# Patient Record
Sex: Female | Born: 1945 | Race: White | Hispanic: No | Smoking: Never smoker
Health system: Southern US, Community
[De-identification: ages and names within clinical notes are randomized; demographics above are authoritative.]

## PROBLEM LIST (undated history)

## (undated) DIAGNOSIS — J069 Acute upper respiratory infection, unspecified: Secondary | ICD-10-CM

## (undated) HISTORY — PX: HIP SURGERY: SHX245

## (undated) HISTORY — PX: ABDOMINAL HYSTERECTOMY: SHX81

---

## 1999-08-24 ENCOUNTER — Encounter: Payer: Self-pay | Admitting: Emergency Medicine

## 1999-08-24 ENCOUNTER — Emergency Department (HOSPITAL_COMMUNITY): Admission: EM | Admit: 1999-08-24 | Discharge: 1999-08-24 | Payer: Self-pay | Admitting: Emergency Medicine

## 1999-09-08 ENCOUNTER — Emergency Department (HOSPITAL_COMMUNITY): Admission: EM | Admit: 1999-09-08 | Discharge: 1999-09-08 | Payer: Self-pay | Admitting: Emergency Medicine

## 2002-03-31 ENCOUNTER — Encounter: Admission: RE | Admit: 2002-03-31 | Discharge: 2002-03-31 | Payer: Self-pay | Admitting: *Deleted

## 2019-05-04 ENCOUNTER — Emergency Department (HOSPITAL_COMMUNITY): Payer: PPO

## 2019-05-04 ENCOUNTER — Other Ambulatory Visit: Payer: Self-pay

## 2019-05-04 ENCOUNTER — Encounter (HOSPITAL_COMMUNITY): Payer: Self-pay | Admitting: *Deleted

## 2019-05-04 ENCOUNTER — Emergency Department (HOSPITAL_COMMUNITY)
Admission: EM | Admit: 2019-05-04 | Discharge: 2019-05-04 | Disposition: A | Discharge status: Home / Self Care | Payer: PPO | Attending: Emergency Medicine | Admitting: Emergency Medicine

## 2019-05-04 DIAGNOSIS — M6281 Muscle weakness (generalized): Secondary | ICD-10-CM

## 2019-05-04 DIAGNOSIS — Z20822 Contact with and (suspected) exposure to covid-19: Secondary | ICD-10-CM | POA: Insufficient documentation

## 2019-05-04 DIAGNOSIS — R531 Weakness: Secondary | ICD-10-CM | POA: Diagnosis not present

## 2019-05-04 DIAGNOSIS — M791 Myalgia, unspecified site: Secondary | ICD-10-CM | POA: Diagnosis present

## 2019-05-04 DIAGNOSIS — R262 Difficulty in walking, not elsewhere classified: Secondary | ICD-10-CM | POA: Diagnosis not present

## 2019-05-04 HISTORY — DX: Acute upper respiratory infection, unspecified: J06.9

## 2019-05-04 LAB — CBC
HCT: 39.4 % (ref 36.0–46.0)
Hemoglobin: 13.2 g/dL (ref 12.0–15.0)
MCH: 30.1 pg (ref 26.0–34.0)
MCHC: 33.5 g/dL (ref 30.0–36.0)
MCV: 90 fL (ref 80.0–100.0)
Platelets: 161 10*3/uL (ref 150–400)
RBC: 4.38 MIL/uL (ref 3.87–5.11)
RDW: 12.9 % (ref 11.5–15.5)
WBC: 7.2 10*3/uL (ref 4.0–10.5)
nRBC: 0 % (ref 0.0–0.2)

## 2019-05-04 LAB — COMPREHENSIVE METABOLIC PANEL
ALT: 15 U/L (ref 0–44)
AST: 47 U/L — ABNORMAL HIGH (ref 15–41)
Albumin: 4.1 g/dL (ref 3.5–5.0)
Alkaline Phosphatase: 153 U/L — ABNORMAL HIGH (ref 38–126)
Anion gap: 10 (ref 5–15)
BUN: 30 mg/dL — ABNORMAL HIGH (ref 8–23)
CO2: 25 mmol/L (ref 22–32)
Calcium: 9.2 mg/dL (ref 8.9–10.3)
Chloride: 107 mmol/L (ref 98–111)
Creatinine, Ser: 0.8 mg/dL (ref 0.44–1.00)
GFR calc Af Amer: >60 mL/min (ref >60)
GFR calc non Af Amer: >60 mL/min (ref >60)
Glucose, Bld: 99 mg/dL (ref 70–99)
Potassium: 3.6 mmol/L (ref 3.5–5.1)
Sodium: 142 mmol/L (ref 135–145)
Total Bilirubin: 0.6 mg/dL (ref 0.3–1.2)
Total Protein: 7.6 g/dL (ref 6.5–8.1)

## 2019-05-04 LAB — SARS CORONAVIRUS 2 (TAT 6-24 HRS): SARS Coronavirus 2: NEGATIVE

## 2019-05-04 LAB — SEDIMENTATION RATE: Sed Rate: 48 mm/hr — ABNORMAL HIGH (ref 0–22)

## 2019-05-04 LAB — C-REACTIVE PROTEIN: CRP: 2.3 mg/dL — ABNORMAL HIGH (ref <1.0)

## 2019-05-04 LAB — TSH: TSH: 1.984 u[IU]/mL (ref 0.350–4.500)

## 2019-05-04 MED ORDER — KETOROLAC TROMETHAMINE 15 MG/ML IJ SOLN
15.0000 mg | Freq: Once | INTRAMUSCULAR | Status: AC
  Administered 2019-05-04: 16:00:00 15 mg via INTRAVENOUS
  Filled 2019-05-04: qty 1

## 2019-05-04 MED ORDER — PREDNISONE 10 MG PO TABS: 20.0000 mg | ORAL_TABLET | Freq: Every day | ORAL | 0 refills | Status: DC

## 2019-05-04 NOTE — ED Notes (Signed)
Pt ambulatory to bathroom using walker, no assistance needed.

## 2019-05-04 NOTE — ED Triage Notes (Signed)
Pt states she is having pain in her legs and arms for over two weeks, trouble walking and would like for "ya'll to know, I have a lump in my breat" (right). States she is having trouble walking due to leg pain.

## 2019-05-04 NOTE — ED Provider Notes (Signed)
[facility] DEPT Provider Note   CSN: [id number] Arrival date & time: 05/04/19  1018     History Chief Complaint  Patient presents with  . Generalized Body Aches    Yesenia Sullivan is a 74 y.o. female.  [name] is a 74 year old woman presenting to the ED with 2 to 3 weeks of worsening weakness.  She reports that, 3 weeks ago, she had no trouble walking at all and felt well and healthy.  Since that time, she has had progressive weakness of her legs and arms which is often accompanied by aching and burning sensation.  She initially tried treating this with over-the-counter Tylenol and Motrin which was helpful but has become less effective as time goes on.  About 2 weeks ago, she began to have mild difficulty walking due to weakness and aching in her thighs and knees.  She continues to try to exercise daily with walking or her stationary bike but she notes increased difficulty bringing her legs over the seat of her bike.  In addition to the aching and weakness in her thighs and upper arms, she also notes some upper and middle back aching without radiation.  On review of systems, she notes some decreased energy and denies headaches, vision changes, decreased appetite, congestion, shortness of breath, urinary symptoms.  Aside from her complaints of weakness and aching, she also brought up a breast mass that has been present for roughly the past 2 years.  She has noted this breast mass and the accompanying skin changes but is been hesitant to be evaluated by physician because she worries about the possibility of breast cancer and how she would respond to chemotherapy.  She denies any personal or family history of hypothyroidism or known rheumatic issues such as rheumatoid arthritis, PMR, dermatomyositis.  She does not currently take any medications.        Past Medical History:  Diagnosis Date  . URI (upper respiratory infection)     There are no  problems to display for this patient.   Past Surgical History:  Procedure Laterality Date  . ABDOMINAL HYSTERECTOMY    . HIP SURGERY       OB History   No obstetric history on file.     History reviewed. No pertinent family history.  Social History   Tobacco Use  . Smoking status: Never Smoker  . Smokeless tobacco: Never Used  Substance Use Topics  . Alcohol use: Never  . Drug use: Never    Home Medications Prior to Admission medications   Not on File    Allergies    Codeine and Penicillins  Review of Systems   Review of Systems  Constitutional: Positive for activity change and fatigue. Negative for fever.  HENT: Negative for congestion, hearing loss and sore throat.   Eyes: Negative for visual disturbance.  Respiratory: Negative for cough, chest tightness, shortness of breath and wheezing.   Cardiovascular: Negative for chest pain and palpitations.  Gastrointestinal: Negative for abdominal distention, abdominal pain, blood in stool, constipation, diarrhea, nausea and vomiting.  Endocrine: Negative for polyuria.  Genitourinary: Negative for dysuria.  Musculoskeletal: Positive for arthralgias, back pain and gait problem. Negative for joint swelling and neck stiffness.  Skin: Negative for color change and rash.  Neurological: Positive for weakness. Negative for dizziness, tremors, seizures, speech difficulty and headaches.  Psychiatric/Behavioral: Negative.     Physical Exam Updated Vital Signs BP (!) 162/108 (BP Location: Left Arm) Comment: nurse was informed  Pulse Marland Kitchen)  102   Temp 97.8 F (36.6 C) (Oral)   Resp 18   Ht 5\' 4"  (1.626 m)   Wt 73.9 kg   SpO2 96%   BMI 27.98 kg/m   Physical Exam Constitutional:      General: She is not in acute distress.    Appearance: Normal appearance. She is normal weight. She is not ill-appearing.  HENT:     Head: Normocephalic and atraumatic.     Nose: Nose normal.  Eyes:     Conjunctiva/sclera: Conjunctivae  normal.     Pupils: Pupils are equal, round, and reactive to light.  Cardiovascular:     Rate and Rhythm: Regular rhythm. Tachycardia present.     Pulses: Normal pulses.     Heart sounds: Normal heart sounds. No murmur. No gallop.   Pulmonary:     Effort: Pulmonary effort is normal. No respiratory distress.     Breath sounds: Normal breath sounds. No wheezing or rales.  Abdominal:     General: Abdomen is flat. Bowel sounds are normal. There is no distension.     Palpations: Abdomen is soft.     Tenderness: There is no abdominal tenderness. There is no guarding.  Musculoskeletal:        General: Tenderness present. No swelling.     Cervical back: Normal range of motion and neck supple. No rigidity or tenderness.     Right lower leg: No edema.     Left lower leg: No edema.     Comments: 5/5 strength for elbow flexion and extension then grip strength.  4/5 strength for hip flexion and knee flexion.  5/5 strength for plantar flexion, dorsiflexion.  Mild tenderness to palpation of the left paraspinal muscles in the lumbar region.  Skin:    General: Skin is warm and dry.     Comments: Roughly 1.5 cm hard nodule noted in her superior right breast with significant skin changes including erythema.  No evidence of drainage.  No changes to the nipple.  Neurological:     Mental Status: She is alert.     ED Results / Procedures / Treatments   Labs (all labs ordered are listed, but only abnormal results are displayed) Labs Reviewed  CBC  COMPREHENSIVE METABOLIC PANEL  SEDIMENTATION RATE  C-REACTIVE PROTEIN    EKG None  Radiology No results found.  Procedures Procedures (including critical care time)  Medications Ordered in ED Medications - No data to display  ED Course  I have reviewed the triage vital signs and the nursing notes.  Pertinent labs & imaging results that were available during my care of the patient were reviewed by me and considered in my medical decision making  (see chart for details).    MDM Rules/Calculators/A&P                      Yesenia Sullivan is a 74 year old woman presenting to the ED with 3 weeks of proximal muscle weakness/aching.  Low suspicion for infectious etiology based on lack of fever focal infectious symptoms.  Greater suspicion for rheumatologic/neurologic etiology.  Differential includes inflammatory issues such as PMR, dermatomyositis, demyelination syndrome, possible breast cancer with brain metastases.  We will begin initial work-up with basic lab work and inflammatory markers in addition to MR brain to assess for brain mets. Final Clinical Impression(s) / ED Diagnoses Final diagnoses:  None    Rx / DC Orders ED Discharge Orders    None  Matilde Haymaker, MD 05/04/19 1740    Carmin Muskrat, MD 05/06/19 [phone]

## 2019-05-04 NOTE — Discharge Instructions (Signed)
You came into the emergency room today primarily concern for weakness of your thighs and upper arms.  Based on our findings today, this may be related to general inflammatory process that is affecting her muscles.  We will plan to treat you with steroids for now and would like you to follow-up with a primary care physician.    We also got head imaging to rule out possible metastases that could be related to a breast cancer.  There were spots noted on the bones of your skull and we are not positive what the spots are.  The radiologist is recommended reimaging with contrast to help better characterize the spots.  I highly recommend that you have this imaging done when you follow-up with your new primary care provider.  I have also attached information for the breast center.  This is for follow-up for the right-sided breast mass that you noted.  I am concerned this breast mass could be cancerous and could be causing other problems.  You can start working up this problem by talking with your new primary care provider or you can try to schedule an appointment directly with the breast center.  Please try to find a primary care provider and be seen in the next week or 2.  I think that you will experience improvement with the steroids that were giving you but your weakness and discomfort may return once you finish the steroids.  If you have trouble finding a primary care provider, feel free to call my family medicine office to ask for an appointment with me.  You can ask for an appointment with Dr. Matilde Haymaker let them know that you were assessed by me in the emergency room.

## 2019-05-10 ENCOUNTER — Emergency Department (HOSPITAL_COMMUNITY): Payer: PPO

## 2019-05-10 ENCOUNTER — Inpatient Hospital Stay (HOSPITAL_COMMUNITY)
Admission: EM | Admit: 2019-05-10 | Discharge: 2019-05-18 | DRG: 478 | Disposition: A | Discharge status: Home Health | Payer: PPO | Attending: Internal Medicine | Admitting: Internal Medicine

## 2019-05-10 ENCOUNTER — Other Ambulatory Visit: Payer: Self-pay

## 2019-05-10 ENCOUNTER — Encounter (HOSPITAL_COMMUNITY): Payer: Self-pay

## 2019-05-10 ENCOUNTER — Other Ambulatory Visit: Payer: Self-pay | Admitting: Family Medicine

## 2019-05-10 DIAGNOSIS — C787 Secondary malignant neoplasm of liver and intrahepatic bile duct: Secondary | ICD-10-CM | POA: Diagnosis not present

## 2019-05-10 DIAGNOSIS — Z419 Encounter for procedure for purposes other than remedying health state, unspecified: Secondary | ICD-10-CM

## 2019-05-10 DIAGNOSIS — Z20822 Contact with and (suspected) exposure to covid-19: Secondary | ICD-10-CM | POA: Diagnosis not present

## 2019-05-10 DIAGNOSIS — Z8781 Personal history of (healed) traumatic fracture: Secondary | ICD-10-CM

## 2019-05-10 DIAGNOSIS — R52 Pain, unspecified: Secondary | ICD-10-CM

## 2019-05-10 DIAGNOSIS — S72401D Unspecified fracture of lower end of right femur, subsequent encounter for closed fracture with routine healing: Secondary | ICD-10-CM | POA: Diagnosis not present

## 2019-05-10 DIAGNOSIS — R9431 Abnormal electrocardiogram [ECG] [EKG]: Secondary | ICD-10-CM | POA: Diagnosis not present

## 2019-05-10 DIAGNOSIS — C50811 Malignant neoplasm of overlapping sites of right female breast: Secondary | ICD-10-CM

## 2019-05-10 DIAGNOSIS — S42301A Unspecified fracture of shaft of humerus, right arm, initial encounter for closed fracture: Secondary | ICD-10-CM | POA: Diagnosis not present

## 2019-05-10 DIAGNOSIS — R531 Weakness: Secondary | ICD-10-CM | POA: Diagnosis not present

## 2019-05-10 DIAGNOSIS — Z170 Estrogen receptor positive status [ER+]: Secondary | ICD-10-CM | POA: Diagnosis not present

## 2019-05-10 DIAGNOSIS — S42302A Unspecified fracture of shaft of humerus, left arm, initial encounter for closed fracture: Secondary | ICD-10-CM | POA: Diagnosis not present

## 2019-05-10 DIAGNOSIS — Z66 Do not resuscitate: Secondary | ICD-10-CM | POA: Diagnosis not present

## 2019-05-10 DIAGNOSIS — Z90710 Acquired absence of both cervix and uterus: Secondary | ICD-10-CM | POA: Diagnosis not present

## 2019-05-10 DIAGNOSIS — W19XXXA Unspecified fall, initial encounter: Secondary | ICD-10-CM | POA: Diagnosis present

## 2019-05-10 DIAGNOSIS — Z515 Encounter for palliative care: Secondary | ICD-10-CM | POA: Diagnosis not present

## 2019-05-10 DIAGNOSIS — M898X9 Other specified disorders of bone, unspecified site: Secondary | ICD-10-CM | POA: Diagnosis not present

## 2019-05-10 DIAGNOSIS — S72401A Unspecified fracture of lower end of right femur, initial encounter for closed fracture: Secondary | ICD-10-CM | POA: Diagnosis not present

## 2019-05-10 DIAGNOSIS — Z79899 Other long term (current) drug therapy: Secondary | ICD-10-CM | POA: Diagnosis not present

## 2019-05-10 DIAGNOSIS — Z03818 Encounter for observation for suspected exposure to other biological agents ruled out: Secondary | ICD-10-CM | POA: Diagnosis not present

## 2019-05-10 DIAGNOSIS — K769 Liver disease, unspecified: Secondary | ICD-10-CM | POA: Diagnosis not present

## 2019-05-10 DIAGNOSIS — M898X5 Other specified disorders of bone, thigh: Secondary | ICD-10-CM

## 2019-05-10 DIAGNOSIS — S72301A Unspecified fracture of shaft of right femur, initial encounter for closed fracture: Secondary | ICD-10-CM | POA: Diagnosis not present

## 2019-05-10 DIAGNOSIS — M84451A Pathological fracture, right femur, initial encounter for fracture: Principal | ICD-10-CM | POA: Diagnosis present

## 2019-05-10 DIAGNOSIS — Z7189 Other specified counseling: Secondary | ICD-10-CM | POA: Diagnosis not present

## 2019-05-10 DIAGNOSIS — C7951 Secondary malignant neoplasm of bone: Secondary | ICD-10-CM | POA: Diagnosis not present

## 2019-05-10 DIAGNOSIS — Z9889 Other specified postprocedural states: Secondary | ICD-10-CM

## 2019-05-10 DIAGNOSIS — S79911A Unspecified injury of right hip, initial encounter: Secondary | ICD-10-CM | POA: Diagnosis not present

## 2019-05-10 DIAGNOSIS — S72143A Displaced intertrochanteric fracture of unspecified femur, initial encounter for closed fracture: Secondary | ICD-10-CM | POA: Diagnosis not present

## 2019-05-10 DIAGNOSIS — R279 Unspecified lack of coordination: Secondary | ICD-10-CM | POA: Diagnosis not present

## 2019-05-10 DIAGNOSIS — M899 Disorder of bone, unspecified: Secondary | ICD-10-CM | POA: Diagnosis not present

## 2019-05-10 DIAGNOSIS — Z743 Need for continuous supervision: Secondary | ICD-10-CM | POA: Diagnosis not present

## 2019-05-10 DIAGNOSIS — S7292XD Unspecified fracture of left femur, subsequent encounter for closed fracture with routine healing: Secondary | ICD-10-CM | POA: Diagnosis not present

## 2019-05-10 DIAGNOSIS — Z791 Long term (current) use of non-steroidal anti-inflammatories (NSAID): Secondary | ICD-10-CM | POA: Diagnosis not present

## 2019-05-10 DIAGNOSIS — Y9301 Activity, walking, marching and hiking: Secondary | ICD-10-CM | POA: Diagnosis present

## 2019-05-10 DIAGNOSIS — S72001A Fracture of unspecified part of neck of right femur, initial encounter for closed fracture: Secondary | ICD-10-CM | POA: Diagnosis not present

## 2019-05-10 DIAGNOSIS — W19XXXD Unspecified fall, subsequent encounter: Secondary | ICD-10-CM | POA: Diagnosis not present

## 2019-05-10 DIAGNOSIS — T148XXA Other injury of unspecified body region, initial encounter: Secondary | ICD-10-CM

## 2019-05-10 DIAGNOSIS — M84452D Pathological fracture, left femur, subsequent encounter for fracture with routine healing: Secondary | ICD-10-CM | POA: Diagnosis not present

## 2019-05-10 DIAGNOSIS — Z6829 Body mass index (BMI) 29.0-29.9, adult: Secondary | ICD-10-CM | POA: Diagnosis not present

## 2019-05-10 DIAGNOSIS — M9711XA Periprosthetic fracture around internal prosthetic right knee joint, initial encounter: Secondary | ICD-10-CM | POA: Diagnosis not present

## 2019-05-10 DIAGNOSIS — Z7952 Long term (current) use of systemic steroids: Secondary | ICD-10-CM | POA: Diagnosis not present

## 2019-05-10 DIAGNOSIS — C799 Secondary malignant neoplasm of unspecified site: Secondary | ICD-10-CM | POA: Diagnosis not present

## 2019-05-10 DIAGNOSIS — S42121A Displaced fracture of acromial process, right shoulder, initial encounter for closed fracture: Secondary | ICD-10-CM | POA: Diagnosis not present

## 2019-05-10 DIAGNOSIS — N63 Unspecified lump in unspecified breast: Secondary | ICD-10-CM | POA: Diagnosis not present

## 2019-05-10 DIAGNOSIS — S79929A Unspecified injury of unspecified thigh, initial encounter: Secondary | ICD-10-CM | POA: Diagnosis not present

## 2019-05-10 DIAGNOSIS — C801 Malignant (primary) neoplasm, unspecified: Secondary | ICD-10-CM | POA: Diagnosis not present

## 2019-05-10 DIAGNOSIS — C50911 Malignant neoplasm of unspecified site of right female breast: Secondary | ICD-10-CM | POA: Diagnosis not present

## 2019-05-10 DIAGNOSIS — M25511 Pain in right shoulder: Secondary | ICD-10-CM

## 2019-05-10 DIAGNOSIS — R609 Edema, unspecified: Secondary | ICD-10-CM | POA: Diagnosis not present

## 2019-05-10 DIAGNOSIS — M84451S Pathological fracture, right femur, sequela: Secondary | ICD-10-CM | POA: Diagnosis not present

## 2019-05-10 DIAGNOSIS — S8991XA Unspecified injury of right lower leg, initial encounter: Secondary | ICD-10-CM | POA: Diagnosis not present

## 2019-05-10 DIAGNOSIS — Z01818 Encounter for other preprocedural examination: Secondary | ICD-10-CM | POA: Diagnosis not present

## 2019-05-10 DIAGNOSIS — S72301D Unspecified fracture of shaft of right femur, subsequent encounter for closed fracture with routine healing: Secondary | ICD-10-CM | POA: Diagnosis not present

## 2019-05-10 DIAGNOSIS — N6310 Unspecified lump in the right breast, unspecified quadrant: Secondary | ICD-10-CM

## 2019-05-10 DIAGNOSIS — I10 Essential (primary) hypertension: Secondary | ICD-10-CM | POA: Diagnosis not present

## 2019-05-10 LAB — RESPIRATORY PANEL BY RT PCR (FLU A&B, COVID)
Influenza A by PCR: NEGATIVE
Influenza B by PCR: NEGATIVE
SARS Coronavirus 2 by RT PCR: NEGATIVE

## 2019-05-10 LAB — CBC WITH DIFFERENTIAL/PLATELET
Abs Immature Granulocytes: 0.07 10*3/uL (ref 0.00–0.07)
Basophils Absolute: 0 10*3/uL (ref 0.0–0.1)
Basophils Relative: 0 %
Eosinophils Absolute: 0 10*3/uL (ref 0.0–0.5)
Eosinophils Relative: 0 %
HCT: 38.2 % (ref 36.0–46.0)
Hemoglobin: 12.6 g/dL (ref 12.0–15.0)
Immature Granulocytes: 1 %
Lymphocytes Relative: 6 %
Lymphs Abs: 0.6 10*3/uL — ABNORMAL LOW (ref 0.7–4.0)
MCH: 29.9 pg (ref 26.0–34.0)
MCHC: 33 g/dL (ref 30.0–36.0)
MCV: 90.7 fL (ref 80.0–100.0)
Monocytes Absolute: 0.4 10*3/uL (ref 0.1–1.0)
Monocytes Relative: 4 %
Neutro Abs: 10.1 10*3/uL — ABNORMAL HIGH (ref 1.7–7.7)
Neutrophils Relative %: 89 %
Platelets: 180 10*3/uL (ref 150–400)
RBC: 4.21 MIL/uL (ref 3.87–5.11)
RDW: 12.9 % (ref 11.5–15.5)
WBC: 11.2 10*3/uL — ABNORMAL HIGH (ref 4.0–10.5)
nRBC: 0 % (ref 0.0–0.2)

## 2019-05-10 LAB — COMPREHENSIVE METABOLIC PANEL
ALT: 20 U/L (ref 0–44)
AST: 51 U/L — ABNORMAL HIGH (ref 15–41)
Albumin: 4.4 g/dL (ref 3.5–5.0)
Alkaline Phosphatase: 168 U/L — ABNORMAL HIGH (ref 38–126)
Anion gap: 12 (ref 5–15)
BUN: 27 mg/dL — ABNORMAL HIGH (ref 8–23)
CO2: 25 mmol/L (ref 22–32)
Calcium: 9.1 mg/dL (ref 8.9–10.3)
Chloride: 102 mmol/L (ref 98–111)
Creatinine, Ser: 0.74 mg/dL (ref 0.44–1.00)
GFR calc Af Amer: >60 mL/min (ref >60)
GFR calc non Af Amer: >60 mL/min (ref >60)
Glucose, Bld: 130 mg/dL — ABNORMAL HIGH (ref 70–99)
Potassium: 3.9 mmol/L (ref 3.5–5.1)
Sodium: 139 mmol/L (ref 135–145)
Total Bilirubin: 0.6 mg/dL (ref 0.3–1.2)
Total Protein: 7.6 g/dL (ref 6.5–8.1)

## 2019-05-10 MED ORDER — ACETAMINOPHEN 325 MG PO TABS: 650.0000 mg | ORAL_TABLET | Freq: Four times a day (QID) | ORAL | Status: DC | PRN

## 2019-05-10 MED ORDER — ONDANSETRON HCL 4 MG/2ML IJ SOLN
4.0000 mg | Freq: Four times a day (QID) | INTRAMUSCULAR | Status: DC | PRN
  Administered 2019-05-16: 4 mg via INTRAVENOUS
  Filled 2019-05-10: qty 2

## 2019-05-10 MED ORDER — MAGNESIUM HYDROXIDE 400 MG/5ML PO SUSP: 30.0000 mL | Freq: Every day | ORAL | Status: DC | PRN

## 2019-05-10 MED ORDER — OXYCODONE HCL 5 MG PO TABS
5.0000 mg | ORAL_TABLET | ORAL | Status: DC | PRN
  Administered 2019-05-10: 5 mg via ORAL
  Filled 2019-05-10: qty 1

## 2019-05-10 MED ORDER — ACETAMINOPHEN 650 MG RE SUPP: 650.0000 mg | Freq: Four times a day (QID) | RECTAL | Status: DC | PRN

## 2019-05-10 MED ORDER — ONDANSETRON HCL 4 MG/2ML IJ SOLN
4.0000 mg | Freq: Once | INTRAMUSCULAR | Status: AC
  Administered 2019-05-10: 4 mg via INTRAVENOUS
  Filled 2019-05-10: qty 2

## 2019-05-10 MED ORDER — HYDROMORPHONE HCL 1 MG/ML IJ SOLN
1.0000 mg | Freq: Once | INTRAMUSCULAR | Status: AC
  Administered 2019-05-10: 1 mg via INTRAVENOUS
  Filled 2019-05-10: qty 1

## 2019-05-10 MED ORDER — ONDANSETRON HCL 4 MG PO TABS
4.0000 mg | ORAL_TABLET | Freq: Four times a day (QID) | ORAL | Status: DC | PRN
  Administered 2019-05-10: 4 mg via ORAL
  Filled 2019-05-10: qty 1

## 2019-05-10 MED ORDER — HYDROMORPHONE HCL 1 MG/ML IJ SOLN
1.0000 mg | INTRAMUSCULAR | Status: DC | PRN
  Administered 2019-05-10 – 2019-05-11 (×4): 1 mg via INTRAVENOUS
  Filled 2019-05-10 (×4): qty 1

## 2019-05-10 MED ORDER — MORPHINE SULFATE (PF) 4 MG/ML IV SOLN
4.0000 mg | Freq: Once | INTRAVENOUS | Status: AC
  Administered 2019-05-10: 4 mg via INTRAVENOUS
  Filled 2019-05-10: qty 1

## 2019-05-10 MED ORDER — HYDRALAZINE HCL 20 MG/ML IJ SOLN: 10.0000 mg | Freq: Four times a day (QID) | INTRAMUSCULAR | Status: DC | PRN

## 2019-05-10 MED ORDER — SORBITOL 70 % SOLN
30.0000 mL | Freq: Every day | Status: DC | PRN
  Filled 2019-05-10: qty 30

## 2019-05-10 MED ORDER — FLEET ENEMA 7-19 GM/118ML RE ENEM: 1.0000 | ENEMA | Freq: Once | RECTAL | Status: DC | PRN

## 2019-05-10 MED ORDER — SODIUM CHLORIDE 0.9% FLUSH
3.0000 mL | Freq: Two times a day (BID) | INTRAVENOUS | Status: DC
  Administered 2019-05-10 – 2019-05-18 (×12): 3 mL via INTRAVENOUS

## 2019-05-10 MED ORDER — SENNA 8.6 MG PO TABS
1.0000 | ORAL_TABLET | Freq: Two times a day (BID) | ORAL | Status: DC
  Administered 2019-05-10 – 2019-05-18 (×12): 8.6 mg via ORAL
  Filled 2019-05-10 (×14): qty 1

## 2019-05-10 NOTE — Discharge Instructions (Addendum)
[facility] Discharge Instructions   General Discharge Instructions  WEIGHT BEARING STATUS: [place] weightbearing right lower extremity. Weightbearing as tolerated left lower extremity  RANGE OF MOTION/ACTIVITY: Okay for unrestricted hip and knee motion  Wound Care: Incisions can be left open to air if there is no drainage. If incision continues to have drainage, follow wound care instructions below. Okay to shower if no drainage from incisions.  DVT/PE prophylaxis: Lovenox  Diet: as you were eating previously.  Can use over the counter stool softeners and bowel preparations, such as Miralax, to help with bowel movements.  Narcotics can be constipating.  Be sure to drink plenty of fluids  PAIN MEDICATION USE AND EXPECTATIONS  You have likely been given narcotic medications to help control your pain.  After a traumatic event that results in an fracture (broken bone) with or without surgery, it is ok to use narcotic pain medications to help control one's pain.  We understand that everyone responds to pain differently and each individual patient will be evaluated on a regular basis for the continued need for narcotic medications. Ideally, narcotic medication use should last no more than 6-8 weeks (coinciding with fracture healing).   As a patient it is your responsibility as well to monitor narcotic medication use and report the amount and frequency you use these medications when you come to your office visit.   We would also advise that if you are using narcotic medications, you should take a dose prior to therapy to maximize you participation.  IF YOU ARE ON NARCOTIC MEDICATIONS IT IS NOT PERMISSIBLE TO OPERATE A MOTOR VEHICLE (MOTORCYCLE/CAR/TRUCK/MOPED) OR HEAVY MACHINERY DO NOT MIX NARCOTICS WITH OTHER CNS (CENTRAL NERVOUS SYSTEM) DEPRESSANTS SUCH AS ALCOHOL   STOP SMOKING OR USING NICOTINE PRODUCTS!!!!  As discussed nicotine severely impairs your body's ability to  heal surgical and traumatic wounds but also impairs bone healing.  Wounds and bone heal by forming microscopic blood vessels (angiogenesis) and nicotine is a vasoconstrictor (essentially, shrinks blood vessels).  Therefore, if vasoconstriction occurs to these microscopic blood vessels they essentially disappear and are unable to deliver necessary nutrients to the healing tissue.  This is one modifiable factor that you can do to dramatically increase your chances of healing your injury.    (This means no smoking, no nicotine gum, patches, etc)  DO NOT USE NONSTEROIDAL ANTI-INFLAMMATORY DRUGS (NSAID'S)  Using products such as Advil (ibuprofen), Aleve (naproxen), Motrin (ibuprofen) for additional pain control during fracture healing can delay and/or prevent the healing response.  If you would like to take over the counter (OTC) medication, Tylenol (acetaminophen) is ok.  However, some narcotic medications that are given for pain control contain acetaminophen as well. Therefore, you should not exceed more than 4000 mg of tylenol in a day if you do not have liver disease.  Also note that there are may OTC medicines, such as cold medicines and allergy medicines that my contain tylenol as well.  If you have any questions about medications and/or interactions please ask your doctor/PA or your pharmacist.      ICE AND ELEVATE INJURED/OPERATIVE EXTREMITY  Using ice and elevating the injured extremity above your heart can help with swelling and pain control.  Icing in a pulsatile fashion, such as 20 minutes on and 20 minutes off, can be followed.    Do not place ice directly on skin. Make sure there is a barrier between to skin and the ice pack.    Using frozen items such  as frozen peas works well as the conform nicely to the are that needs to be iced.  USE AN ACE WRAP OR TED HOSE FOR SWELLING CONTROL  In addition to icing and elevation, Ace wraps or TED hose are used to help limit and resolve swelling.  It is  recommended to use Ace wraps or TED hose until you are informed to stop.    When using Ace Wraps start the wrapping distally (farthest away from the body) and wrap proximally (closer to the body)   Example: If you had surgery on your leg or thing and you do not have a splint on, start the ace wrap at the toes and work your way up to the thigh        If you had surgery on your upper extremity and do not have a splint on, start the ace wrap at your fingers and work your way up to the upper arm   [place]: [phone]   VISIT OUR WEBSITE FOR ADDITIONAL INFORMATION: [email or web address]     Discharge Wound Care Instructions  Do NOT apply any ointments, solutions or lotions to pin sites or surgical wounds.  These prevent needed drainage and even though solutions like hydrogen peroxide kill bacteria, they also damage cells lining the pin sites that help fight infection.  Applying lotions or ointments can keep the wounds moist and can cause them to breakdown and open up as well. This can increase the risk for infection. When in doubt call the office.  Surgical incisions should be dressed daily.  If any drainage is noted, use one layer of adaptic, then gauze, Kerlix, and an ace wrap.  Once the incision is completely dry and without drainage, it may be left open to air out.  Showering may begin 36-48 hours later.  Cleaning gently with soap and water.  Traumatic wounds should be dressed daily as well.    One layer of adaptic, gauze, Kerlix, then ace wrap.  The adaptic can be discontinued once the draining has ceased    If you have a wet to dry dressing: wet the gauze with saline the squeeze as much saline out so the gauze is moist (not soaking wet), place moistened gauze over wound, then place a dry gauze over the moist one, followed by Kerlix wrap, then ace wrap.

## 2019-05-10 NOTE — ED Notes (Signed)
Patient transported to X-ray 

## 2019-05-10 NOTE — ED Triage Notes (Signed)
Per EMS, pt was told she may have metastatic bone cancer today. Pt felt a pop in her right leg while going up a step. She has swelling above her right knee. Pt given 250 mcg fentanyl w/o relief. Pt has noticed more aching in the area over past few weeks. Pt is on prednisone for generalized pain.   Bp 182/111 HR 93 RR 20 O2 96%

## 2019-05-10 NOTE — H&P (Signed)
[facility] History and Physical  Yesenia Sullivan [id number] DOB: [birth year] DOA: 05/10/2019 PCP: Patient, No Pcp Per  Patient coming from: home  Chief Complaint: Fracture of right femur  History of Present Illness: Yesenia Sullivan is a 74 y.o. female with no significant past medical history and no medicines at home except for multivitamins.  She noticed a right breast lump 1 year ago.  She did not seek any medical opinion and says she was '' trying to pray this away." She had a right hip replacement at the age of 57.  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  4 to 6 weeks ago, patient started feeling fatigue and intermittent pain in her neck, right arm and hip.  She started having weakness in her thigh muscles which became progressively worse. On 3/17, patient came to the ED.  MRI brain was done at that time which showed findings suspicious of osseous metastatic disease.  Documentation from that visit, patient showed strong preference to be discharged and follow-up as an outpatient.  She was asked to establish a primary care provider. She saw a PCP at [place] today.  Patient reports she was referred to breast clinic for further evaluation.  On her way out of the office, patient suddenly felt a pop on her right thigh followed by severe pain and obvious deformity. EMS was called and patient was sent to the ED.  In the ED, patient is afebrile, heart rate in the 80s, blood pressure 148/104, oxygen saturation 1 to 90% on room air. Labs showed sodium 139, potassium 2.9, BUN/creatinine 27/0.74, WBC count 11.2, hemoglobin 12.6, platelet 180. Respiratory panel is negative) they commented that the and Covid Chest x-ray normal. Right femur x-ray showed severely displaced and angulated distal right femoral shaft Fracture. Orthopedic surgery consultation was done.  Patient is tentatively planned for surgical fixation tomorrow.   Review of Systems:  All  systems were reviewed and were negative unless otherwise mentioned in the HPI  At the time of my evaluation, patient was lying on a stretcher in the ED.  Pain controlled with IV Dilaudid.  Patient seems to carry a lot of grief for not getting evaluated for breast lump when she noticed it a year ago. Husband died 7 years ago.  No kids.  Patient has a friend who patient is self updating.   Past medical history: Past Medical History:  Diagnosis Date  . URI (upper respiratory infection)     Past surgical history: Past Surgical History:  Procedure Laterality Date  . ABDOMINAL HYSTERECTOMY    . HIP SURGERY      Social History:  reports that she has never smoked. She has never used smokeless tobacco. She reports that she does not drink alcohol or use drugs.  Allergies:  Allergies  Allergen Reactions  . Codeine Nausea And Vomiting  . Penicillins Other (See Comments)    Causes skin rash     Family history:  History reviewed. No pertinent family history.   Home Meds: Prior to Admission medications   Medication Sig Start Date End Date Taking? Authorizing Provider  acetaminophen (TYLENOL) 325 MG tablet Take 325 mg by mouth every 6 (six) hours as needed for mild pain or headache.   Yes [provider]  Cholecalciferol (VITAMIN D3 PO) Take 1 capsule by mouth daily.   Yes [provider]  ibuprofen (ADVIL) 200 MG tablet Take 200 mg by mouth every 6 (six) hours as needed for  headache or moderate pain.   Yes [provider]  MAGNESIUM PO Take 1 tablet by mouth daily.   Yes [provider]  Multiple Vitamin (MULTIVITAMIN WITH MINERALS) TABS tablet Take 1 tablet by mouth daily.   Yes [provider]  predniSONE (DELTASONE) 10 MG tablet Take 2 tablets (20 mg total) by mouth daily with breakfast. Take 2 pills a day for 10 days then decrease to 1 pill for 2 days and then 1/2 pill for 2 days. 05/04/19  Yes Matilde Haymaker, MD    Physical Exam: Vitals:    05/10/19 1619 05/10/19 1623 05/10/19 1700 05/10/19 1730  BP:  (!) 167/91 (!) 146/81 (!) 147/78  Pulse: 84 81 82 84  Resp: 18 16 15 17   Temp:      TempSrc:      SpO2: 98% 99% (!) 86% 92%  Weight:      Height:       Wt Readings from Last 3 Encounters:  05/10/19 74.4 kg  05/04/19 73.9 kg   Body mass index is 29.52 kg/m.  General exam: Pain controlled at the time of my evaluation Skin: No rashes, lesions or ulcers. HEENT: Atraumatic, normocephalic, supple neck, no obvious bleeding Lungs: Clear to auscultation bilaterally CVS: Regular rate and rhythm, no murmur GI/Abd soft, nontender, nondistended, bowel sound present CNS: Alert, awake, oriented x3 Psychiatry: Anxious look Extremities: No pedal edema, no calf tenderness, short-term and internally rotated right lower extremity.  Swelling on the thigh.     Consult Orders  (From admission, onward)         Start     Ordered   05/10/19 1732  Consult to hospitalist  ALL PATIENTS BEING ADMITTED/HAVING PROCEDURES NEED COVID-19 SCREENING 9851  Once    Comments: ALL PATIENTS BEING ADMITTED/HAVING PROCEDURES NEED COVID-19 SCREENING 9851  Provider:  (Not yet assigned)  Question Answer Comment  Place call to: [facility]   Reason for Consult Admit      05/10/19 1731          Labs on Admission:   CBC: Recent Labs  Lab 05/04/19 1223 05/10/19 1530  WBC 7.2 11.2*  NEUTROABS  --  10.1*  HGB 13.2 12.6  HCT 39.4 38.2  MCV 90.0 90.7  PLT 161 [id number]    Basic Metabolic Panel: Recent Labs  Lab 05/04/19 1223 05/10/19 1530  NA 142 139  K 3.6 3.9  CL 107 102  CO2 25 25  GLUCOSE 99 130*  BUN 30* 27*  CREATININE 0.80 0.74  CALCIUM 9.2 9.1    Liver Function Tests: Recent Labs  Lab 05/04/19 1223 05/10/19 1530  AST 47* 51*  ALT 15 20  ALKPHOS 153* 168*  BILITOT 0.6 0.6  PROT 7.6 7.6  ALBUMIN 4.1 4.4   No results for input(s): LIPASE, AMYLASE in the last 168 hours. No results for input(s): AMMONIA in the last  168 hours.  Cardiac Enzymes: No results for input(s): CKTOTAL, CKMB, CKMBINDEX, TROPONINI in the last 168 hours.  BNP (last 3 results) No results for input(s): BNP in the last 8760 hours.  ProBNP (last 3 results) No results for input(s): PROBNP in the last 8760 hours.  CBG: No results for input(s): GLUCAP in the last 168 hours.  Lipase  No results found for: LIPASE   Urinalysis No results found for: COLORURINE, APPEARANCEUR, LABSPEC, PHURINE, GLUCOSEU, HGBUR, BILIRUBINUR, KETONESUR, PROTEINUR, UROBILINOGEN, NITRITE, LEUKOCYTESUR   Drugs of Abuse  No results found for: [place], [place], Dewy Rose, [place], THCU, LABBARB  Radiological Exams on Admission: DG Chest 1 View  Result Date: 05/10/2019 CLINICAL DATA:  Preop for femur surgery. EXAM: CHEST  1 VIEW COMPARISON:  None. FINDINGS: The heart size and mediastinal contours are within normal limits. Both lungs are clear. The visualized skeletal structures are unremarkable. IMPRESSION: No active disease. Electronically Signed   By: Marijo Conception M.D.   On: 05/10/2019 15:26   DG HIP UNILAT WITH PELVIS 2-3 VIEWS RIGHT  Result Date: 05/10/2019 CLINICAL DATA:  Right leg injury. EXAM: DG HIP (WITH OR WITHOUT PELVIS) 2-3V RIGHT COMPARISON:  None. FINDINGS: Status post right total hip arthroplasty. The right acetabular and femoral components appear to be well situated. No fracture or dislocation is seen involving the visualized skeleton. IMPRESSION: No acute abnormality seen in the right hip. Status post right total hip arthroplasty. Electronically Signed   By: Marijo Conception M.D.   On: 05/10/2019 15:26   DG FEMUR, MIN 2 VIEWS RIGHT  Result Date: 05/10/2019 CLINICAL DATA:  Right leg injury. EXAM: RIGHT FEMUR 2 VIEWS COMPARISON:  None. FINDINGS: Severely displaced and angulated fracture is seen involving the distal right femoral shaft. Status post right total hip arthroplasty. No soft tissue abnormality is noted. IMPRESSION: Severely  displaced and angulated distal right femoral shaft fracture. Electronically Signed   By: Marijo Conception M.D.   On: 05/10/2019 15:24     ------------------------------------------------------------------------------------------------------ Assessment/Plan: Active Problems:   * No active hospital problems. *  Right femur shaft fracture Pathological fracture -Possibility of metastatic cancer.  Unclear if she has metastasis at the fracture site as well -May have some degree of osteoporosis as well -Orthopedics consulted from the ED.  Tentative plan for surgical fixation tomorrow. -Pain control with IV Dilaudid.  Elevated blood pressure -No documented history of hypertension.  Not on medicines at home.  However her blood pressure is constantly elevated in the hospital over 150.  Patient may have undiagnosed hypertension or the blood pressure rise could be secondary to pain.   -IV hydralazine as needed ordered.  Preoperative risk assessment -As mentioned above, no documented history of cardiac risk factors.  Before her last visit in the ED, she had not seen a physician in several years. -She got right hip replacement at the age of 63 and did not have any perioperative complication. -Currently no cardiac symptoms.  EKG with normal sinus rhythm at 65 bpm with no ST-T wave changes or QTC prolongation. -At this time, patient is at acceptable risk for planned procedure.  Right breast lump Possible osseous metastasis -High degree of suspicion of breast cancer with metastasis. -MRI brain from 3/17 showed 13 mm focus of T2 and diffusion-weighted signal hyperintensity within the right frontoparietal calvarium. There is also abnormal T1 hypointense marrow signal within portions of the visualized upper cervical spine with relative sparing of the C3 vertebra. Findings are indeterminate and osseous metastatic disease cannot be excluded.  -Patient was seen by PCP today and referred to breast center.   Needs biopsy done and subsequent oncology evaluation as an outpatient.  DVT prophylaxis:  SCDs for now.  Lovenox/heparin post procedure Antimicrobials:  None Fluid: None Diet: Regular diet.  N.p.o. after midnight  Code Status:  Full code Mobility: PT evaluation post procedure Family Communication:  Patient updating her friend. Discharge plan:  Anticipated date and disposition: Surgery tomorrow.  Home versus rehab post procedure  Consultants: Orthopedics ----------------------------------------------------------------------------------------------------------------------------------------------------------- Severity of Illness: The appropriate patient status for this patient is INPATIENT. Inpatient status is judged to be  reasonable and necessary in order to provide the required intensity of service to ensure the patient's safety. The patient's presenting symptoms, physical exam findings, and initial radiographic and laboratory data in the context of their chronic comorbidities is felt to place them at high risk for further clinical deterioration. Furthermore, it is not anticipated that the patient will be medically stable for discharge from the hospital within 2 midnights of admission. The following factors support the patient status of inpatient.   " The patient's presenting symptoms include right femur fracture and pain. " The worrisome physical exam findings include right thigh pain. " The initial radiographic and laboratory data are worrisome because of right femur fracture. " The chronic co-morbidities include possible metastatic disease.   * I certify that at the point of admission it is my clinical judgment that the patient will require inpatient hospital care spanning beyond 2 midnights from the point of admission due to high intensity of service, high risk for further deterioration and high frequency of surveillance  required.*   -----------------------------------------------------------------------------------------------------  Cline Cools, MD [facility] Pager: [phone] (Secure Chat preferred). 05/10/2019
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
## 2019-05-10 NOTE — ED Provider Notes (Signed)
[facility] DEPT Provider Note   CSN: [id number] Arrival date & time: 05/10/19  1354     History Chief Complaint  Patient presents with  . Leg Pain  . Leg Swelling    Yesenia Sullivan is a 74 y.o. female.  74 y.o female with a PMH of right breast lump presents to the ED status post fall.  Patient reports she was at her doctor's office when she walked out of the office and suddenly felt her right leg pop, reports there was pain to the area along with an obvious deformity. She states she was recently diagnosed with metastatic bone cancer, although they have not determined the primary source.  Patient reports she was seen here previously, and states she has felt more "bone pain "in the last month.  Patient has not had any follow-up of her oncology, reports she attended to establish care although she was "trying to pray this away ".  She denies any pain in her chest, shortness of breath, fevers or other complaints.  The history is provided by the patient.       Past Medical History:  Diagnosis Date  . URI (upper respiratory infection)     There are no problems to display for this patient.   Past Surgical History:  Procedure Laterality Date  . ABDOMINAL HYSTERECTOMY    . HIP SURGERY       OB History   No obstetric history on file.     History reviewed. No pertinent family history.  Social History   Tobacco Use  . Smoking status: Never Smoker  . Smokeless tobacco: Never Used  Substance Use Topics  . Alcohol use: Never  . Drug use: Never    Home Medications Prior to Admission medications   Medication Sig Start Date End Date Taking? Authorizing Provider  acetaminophen (TYLENOL) 325 MG tablet Take 325 mg by mouth every 6 (six) hours as needed for mild pain or headache.   Yes [provider]  Cholecalciferol (VITAMIN D3 PO) Take 1 capsule by mouth daily.   Yes [provider]  ibuprofen (ADVIL) 200 MG tablet Take 200 mg  by mouth every 6 (six) hours as needed for headache or moderate pain.   Yes [provider]  MAGNESIUM PO Take 1 tablet by mouth daily.   Yes [provider]  Multiple Vitamin (MULTIVITAMIN WITH MINERALS) TABS tablet Take 1 tablet by mouth daily.   Yes [provider]  predniSONE (DELTASONE) 10 MG tablet Take 2 tablets (20 mg total) by mouth daily with breakfast. Take 2 pills a day for 10 days then decrease to 1 pill for 2 days and then 1/2 pill for 2 days. 05/04/19  Yes Matilde Haymaker, MD    Allergies    Codeine and Penicillins  Review of Systems   Review of Systems  Constitutional: Negative for fever.  HENT: Negative for sore throat.   Respiratory: Negative for shortness of breath.   Cardiovascular: Negative for chest pain.  Gastrointestinal: Negative for abdominal pain, constipation, nausea and vomiting.  Genitourinary: Negative for flank pain.  Musculoskeletal: Positive for arthralgias, joint swelling and myalgias.  Skin: Positive for color change. Negative for pallor and wound.  Neurological: Negative for light-headedness and headaches.  All other systems reviewed and are negative.   Physical Exam Updated Vital Signs BP (!) 147/78   Pulse 84   Temp 98.1 F (36.7 C) (Oral)   Resp 17   Ht 5' 2.5" (1.588 m)  Wt 74.4 kg   SpO2 92%   BMI 29.52 kg/m   Physical Exam Vitals and nursing note reviewed.  Constitutional:      General: She is in acute distress.     Appearance: Normal appearance.  HENT:     Head: Normocephalic and atraumatic.     Nose: Nose normal.     Mouth/Throat:     Mouth: Mucous membranes are moist.  Eyes:     Pupils: Pupils are equal, round, and reactive to light.  Cardiovascular:     Rate and Rhythm: Normal rate.  Pulmonary:     Effort: Pulmonary effort is normal.  Abdominal:     General: Abdomen is flat.     Tenderness: There is no abdominal tenderness.  Musculoskeletal:     Cervical back: Normal range of motion and neck  supple.     Right upper leg: Deformity, tenderness and bony tenderness present.       Legs:     Comments: DP,PT pulses presents, capillary refill is intact.  Sensation is intact.  Obvious deformity noted to the distal aspect of free femur with no tenting in the skin.  Skin:    General: Skin is warm and dry.  Neurological:     Mental Status: She is alert and oriented to person, place, and time.     ED Results / Procedures / Treatments   Labs (all labs ordered are listed, but only abnormal results are displayed) Labs Reviewed  CBC WITH DIFFERENTIAL/PLATELET - Abnormal; Notable for the following components:      Result Value   WBC 11.2 (*)    Neutro Abs 10.1 (*)    Lymphs Abs 0.6 (*)    All other components within normal limits  COMPREHENSIVE METABOLIC PANEL - Abnormal; Notable for the following components:   Glucose, Bld 130 (*)    BUN 27 (*)    AST 51 (*)    Alkaline Phosphatase 168 (*)    All other components within normal limits  RESPIRATORY PANEL BY RT PCR (FLU A&B, COVID)    EKG EKG Interpretation  Date/Time:  Tuesday May 10 2019 15:39:03 EDT Ventricular Rate:  68 PR Interval:    QRS Duration: 100 QT Interval:  398 QTC Calculation: 424 R Axis:   -68 Text Interpretation: Sinus rhythm Left anterior fascicular block Abnormal R-wave progression, late transition No prio rECG for comparison. No STEMI Confirmed by Antony Blackbird [phone]) on 05/10/2019 3:58:54 PM   Radiology DG Chest 1 View  Result Date: 05/10/2019 CLINICAL DATA:  Preop for femur surgery. EXAM: CHEST  1 VIEW COMPARISON:  None. FINDINGS: The heart size and mediastinal contours are within normal limits. Both lungs are clear. The visualized skeletal structures are unremarkable. IMPRESSION: No active disease. Electronically Signed   By: Marijo Conception M.D.   On: 05/10/2019 15:26   DG HIP UNILAT WITH PELVIS 2-3 VIEWS RIGHT  Result Date: 05/10/2019 CLINICAL DATA:  Right leg injury. EXAM: DG HIP (WITH OR  WITHOUT PELVIS) 2-3V RIGHT COMPARISON:  None. FINDINGS: Status post right total hip arthroplasty. The right acetabular and femoral components appear to be well situated. No fracture or dislocation is seen involving the visualized skeleton. IMPRESSION: No acute abnormality seen in the right hip. Status post right total hip arthroplasty. Electronically Signed   By: Marijo Conception M.D.   On: 05/10/2019 15:26   DG FEMUR, MIN 2 VIEWS RIGHT  Result Date: 05/10/2019 CLINICAL DATA:  Right leg injury. EXAM: RIGHT FEMUR 2  VIEWS COMPARISON:  None. FINDINGS: Severely displaced and angulated fracture is seen involving the distal right femoral shaft. Status post right total hip arthroplasty. No soft tissue abnormality is noted. IMPRESSION: Severely displaced and angulated distal right femoral shaft fracture. Electronically Signed   By: Marijo Conception M.D.   On: 05/10/2019 15:24    Procedures Procedures (including critical care time)  Medications Ordered in ED Medications  morphine 4 MG/ML injection 4 mg (4 mg Intravenous Given 05/10/19 1454)  ondansetron (ZOFRAN) injection 4 mg (4 mg Intravenous Given 05/10/19 1455)  HYDROmorphone (DILAUDID) injection 1 mg (1 mg Intravenous Given 05/10/19 1645)    ED Course  I have reviewed the triage vital signs and the nursing notes.  Pertinent labs & imaging results that were available during my care of the patient were reviewed by me and considered in my medical decision making (see chart for details).    MDM Rules/Calculators/A&P   Patient with no past medical history aside from right-sided breast lump presents to the ED status post fall upon leaving doctor's office.  According to patient she was seen here about a week ago for generalized weakness, reports she was very active up until a month ago when she began to have "bone pain".  Patient was ad doctors appointment today and reports upon leaving she felt her right leg pop, obvious deformity noted to the area.   Otherwise neurovascularly intact.  Suspect likely pathological fracture.  Evaluation patient appears uncomfortable, given morphine along with Zofran to help with her pain.  Reports no improvement.  A chest x-ray was also obtained as I suspect patient will likely need surgical repair.  Chest x-ray showed no acute process.  Xray of her right femur showed: Severely displaced and angulated distal right femoral shaft  fracture.       4:20 PM Spoke to Dr. Lorin Mercy, who advised that patient will likely be repaired by trauma team, Dr. Doreatha Martin.  Sometime tomorrow.  Will order COVID-19 test.  Rotation of her labs showed a mild leukocytosis, hemoglobin is within normal limits.  CMP without electrolyte abnormality, BUN is slightly elevated, level is within normal limits.  LFTs are remarkable for slight elevation of her AST.  Viewed of her chart extensively by me, showed an MRI brain without contrast which showed a possibility for metastatic disease.  Patient does not have any oncology set up, has not had a biopsy of her breast lump as I suspect this is likely the primary, patient will need fixation of her right femur along with further evaluation.  5:40 PM Spoke to hospitalist service who will admit patient for further management, appreciate their help.   Portions of this note were generated with [employer]. Dictation errors may occur despite best attempts at proofreading.  Final Clinical Impression(s) / ED Diagnoses Final diagnoses:  Closed fracture of shaft of right femur, unspecified fracture morphology, initial encounter [facility])    Rx / [place] Orders ED Discharge Orders    None       Janeece Fitting, PA-C 05/10/19 Stevens, Gwenyth Allegra, MD 05/13/19 [phone]

## 2019-05-11 ENCOUNTER — Encounter (HOSPITAL_COMMUNITY): Payer: Self-pay | Admitting: Internal Medicine

## 2019-05-11 ENCOUNTER — Inpatient Hospital Stay (HOSPITAL_COMMUNITY): Payer: PPO

## 2019-05-11 ENCOUNTER — Inpatient Hospital Stay (HOSPITAL_COMMUNITY): Payer: PPO | Admitting: Certified Registered Nurse Anesthetist

## 2019-05-11 ENCOUNTER — Other Ambulatory Visit: Payer: Self-pay

## 2019-05-11 ENCOUNTER — Encounter (HOSPITAL_COMMUNITY)
Admission: EM | Disposition: A | Discharge status: Home Health | Payer: Self-pay | Source: Home / Self Care | Attending: Internal Medicine

## 2019-05-11 HISTORY — PX: ORIF FEMUR FRACTURE: SHX2119

## 2019-05-11 LAB — BASIC METABOLIC PANEL WITH GFR
Anion gap: 9 (ref 5–15)
BUN: 31 mg/dL — ABNORMAL HIGH (ref 8–23)
CO2: 26 mmol/L (ref 22–32)
Calcium: 9 mg/dL (ref 8.9–10.3)
Chloride: 104 mmol/L (ref 98–111)
Creatinine, Ser: 0.96 mg/dL (ref 0.44–1.00)
GFR calc Af Amer: >60 mL/min
GFR calc non Af Amer: 58 mL/min — ABNORMAL LOW
Glucose, Bld: 91 mg/dL (ref 70–99)
Potassium: 4 mmol/L (ref 3.5–5.1)
Sodium: 139 mmol/L (ref 135–145)

## 2019-05-11 LAB — CBC
HCT: 37.2 % (ref 36.0–46.0)
Hemoglobin: 12.1 g/dL (ref 12.0–15.0)
MCH: 30.1 pg (ref 26.0–34.0)
MCHC: 32.5 g/dL (ref 30.0–36.0)
MCV: 92.5 fL (ref 80.0–100.0)
Platelets: 159 K/uL (ref 150–400)
RBC: 4.02 MIL/uL (ref 3.87–5.11)
RDW: 13.2 % (ref 11.5–15.5)
WBC: 8.8 K/uL (ref 4.0–10.5)
nRBC: 0 % (ref 0.0–0.2)

## 2019-05-11 LAB — VITAMIN D 25 HYDROXY (VIT D DEFICIENCY, FRACTURES): Vit D, 25-Hydroxy: 44.24 ng/mL (ref 30–100)

## 2019-05-11 SURGERY — OPEN REDUCTION INTERNAL FIXATION (ORIF) DISTAL FEMUR FRACTURE
Anesthesia: General | Site: Leg Upper | Laterality: Right

## 2019-05-11 MED ORDER — PROPOFOL 10 MG/ML IV BOLUS
INTRAVENOUS | Status: AC
  Filled 2019-05-11: qty 20

## 2019-05-11 MED ORDER — ROCURONIUM BROMIDE 10 MG/ML (PF) SYRINGE
PREFILLED_SYRINGE | INTRAVENOUS | Status: AC
  Filled 2019-05-11: qty 10

## 2019-05-11 MED ORDER — CEFAZOLIN SODIUM-DEXTROSE 2-4 GM/100ML-% IV SOLN
2.0000 g | Freq: Three times a day (TID) | INTRAVENOUS | Status: AC
  Administered 2019-05-11 – 2019-05-12 (×3): 2 g via INTRAVENOUS
  Filled 2019-05-11 (×3): qty 100

## 2019-05-11 MED ORDER — OXYCODONE HCL 5 MG PO TABS
5.0000 mg | ORAL_TABLET | ORAL | Status: DC | PRN
  Administered 2019-05-11 – 2019-05-17 (×13): 10 mg via ORAL
  Filled 2019-05-11 (×13): qty 2

## 2019-05-11 MED ORDER — ONDANSETRON HCL 4 MG/2ML IJ SOLN
INTRAMUSCULAR | Status: AC
  Filled 2019-05-11: qty 2

## 2019-05-11 MED ORDER — FENTANYL CITRATE (PF) 250 MCG/5ML IJ SOLN
INTRAMUSCULAR | Status: DC | PRN
  Administered 2019-05-11: 100 ug via INTRAVENOUS
  Administered 2019-05-11 (×2): 25 ug via INTRAVENOUS

## 2019-05-11 MED ORDER — FENTANYL CITRATE (PF) 100 MCG/2ML IJ SOLN
INTRAMUSCULAR | Status: AC
  Filled 2019-05-11: qty 2

## 2019-05-11 MED ORDER — ROCURONIUM BROMIDE 10 MG/ML (PF) SYRINGE
PREFILLED_SYRINGE | INTRAVENOUS | Status: DC | PRN
  Administered 2019-05-11: 50 mg via INTRAVENOUS
  Administered 2019-05-11: 20 mg via INTRAVENOUS

## 2019-05-11 MED ORDER — EPHEDRINE SULFATE-NACL 50-0.9 MG/10ML-% IV SOSY
PREFILLED_SYRINGE | INTRAVENOUS | Status: DC | PRN
  Administered 2019-05-11: 10 mg via INTRAVENOUS
  Administered 2019-05-11: 15 mg via INTRAVENOUS
  Administered 2019-05-11: 10 mg via INTRAVENOUS

## 2019-05-11 MED ORDER — DIPHENHYDRAMINE HCL 50 MG/ML IJ SOLN
INTRAMUSCULAR | Status: AC
  Filled 2019-05-11: qty 1

## 2019-05-11 MED ORDER — ONDANSETRON HCL 4 MG/2ML IJ SOLN
INTRAMUSCULAR | Status: DC | PRN
  Administered 2019-05-11: 4 mg via INTRAVENOUS

## 2019-05-11 MED ORDER — ACETAMINOPHEN 10 MG/ML IV SOLN
INTRAVENOUS | Status: DC | PRN
  Administered 2019-05-11: 1000 mg via INTRAVENOUS

## 2019-05-11 MED ORDER — ACETAMINOPHEN 500 MG PO TABS: 1000.0000 mg | ORAL_TABLET | Freq: Once | ORAL | Status: DC

## 2019-05-11 MED ORDER — LIDOCAINE 2% (20 MG/ML) 5 ML SYRINGE
INTRAMUSCULAR | Status: DC | PRN
  Administered 2019-05-11: 100 mg via INTRAVENOUS

## 2019-05-11 MED ORDER — DEXAMETHASONE SODIUM PHOSPHATE 10 MG/ML IJ SOLN
INTRAMUSCULAR | Status: AC
  Filled 2019-05-11: qty 1

## 2019-05-11 MED ORDER — FENTANYL CITRATE (PF) 250 MCG/5ML IJ SOLN
INTRAMUSCULAR | Status: AC
  Filled 2019-05-11: qty 5

## 2019-05-11 MED ORDER — OXYCODONE HCL 5 MG/5ML PO SOLN: 5.0000 mg | Freq: Once | ORAL | Status: DC | PRN

## 2019-05-11 MED ORDER — PROPOFOL 10 MG/ML IV BOLUS
INTRAVENOUS | Status: DC | PRN
  Administered 2019-05-11: 130 mg via INTRAVENOUS

## 2019-05-11 MED ORDER — OXYCODONE HCL 5 MG PO TABS: 5.0000 mg | ORAL_TABLET | Freq: Once | ORAL | Status: DC | PRN

## 2019-05-11 MED ORDER — HYDROMORPHONE HCL 1 MG/ML IJ SOLN: 1.0000 mg | INTRAMUSCULAR | Status: DC | PRN

## 2019-05-11 MED ORDER — FENTANYL CITRATE (PF) 100 MCG/2ML IJ SOLN
25.0000 ug | INTRAMUSCULAR | Status: DC | PRN
  Administered 2019-05-11: 25 ug via INTRAVENOUS

## 2019-05-11 MED ORDER — VANCOMYCIN HCL 1000 MG IV SOLR
INTRAVENOUS | Status: DC | PRN
  Administered 2019-05-11: 1000 mg via TOPICAL

## 2019-05-11 MED ORDER — PHENYLEPHRINE 40 MCG/ML (10ML) SYRINGE FOR IV PUSH (FOR BLOOD PRESSURE SUPPORT)
PREFILLED_SYRINGE | INTRAVENOUS | Status: DC | PRN
  Administered 2019-05-11: 80 ug via INTRAVENOUS
  Administered 2019-05-11 (×2): 120 ug via INTRAVENOUS
  Administered 2019-05-11: 40 ug via INTRAVENOUS

## 2019-05-11 MED ORDER — LACTATED RINGERS IV SOLN: INTRAVENOUS | Status: DC

## 2019-05-11 MED ORDER — ACETAMINOPHEN 10 MG/ML IV SOLN
INTRAVENOUS | Status: AC
  Filled 2019-05-11: qty 100

## 2019-05-11 MED ORDER — PHENYLEPHRINE HCL-NACL 10-0.9 MG/250ML-% IV SOLN
INTRAVENOUS | Status: DC | PRN
  Administered 2019-05-11: 40 ug/min via INTRAVENOUS

## 2019-05-11 MED ORDER — CEFAZOLIN SODIUM-DEXTROSE 2-3 GM-%(50ML) IV SOLR
INTRAVENOUS | Status: DC | PRN
  Administered 2019-05-11: 2 g via INTRAVENOUS

## 2019-05-11 MED ORDER — CEFAZOLIN SODIUM-DEXTROSE 2-4 GM/100ML-% IV SOLN
INTRAVENOUS | Status: AC
  Administered 2019-05-11: 2000 mg
  Filled 2019-05-11: qty 100

## 2019-05-11 MED ORDER — PROMETHAZINE HCL 25 MG/ML IJ SOLN: 6.2500 mg | INTRAMUSCULAR | Status: DC | PRN

## 2019-05-11 MED ORDER — PHENYLEPHRINE 40 MCG/ML (10ML) SYRINGE FOR IV PUSH (FOR BLOOD PRESSURE SUPPORT)
PREFILLED_SYRINGE | INTRAVENOUS | Status: AC
  Filled 2019-05-11: qty 10

## 2019-05-11 MED ORDER — LIDOCAINE 2% (20 MG/ML) 5 ML SYRINGE
INTRAMUSCULAR | Status: AC
  Filled 2019-05-11: qty 5

## 2019-05-11 MED ORDER — DEXAMETHASONE SODIUM PHOSPHATE 10 MG/ML IJ SOLN
INTRAMUSCULAR | Status: DC | PRN
  Administered 2019-05-11: 4 mg via INTRAVENOUS

## 2019-05-11 MED ORDER — HYDROMORPHONE HCL 1 MG/ML IJ SOLN
1.0000 mg | INTRAMUSCULAR | Status: DC | PRN
  Administered 2019-05-13: 1 mg via INTRAVENOUS
  Filled 2019-05-11: qty 1

## 2019-05-11 MED ORDER — ENOXAPARIN SODIUM 40 MG/0.4ML ~~LOC~~ SOLN
40.0000 mg | SUBCUTANEOUS | Status: AC
  Administered 2019-05-12 – 2019-05-15 (×3): 40 mg via SUBCUTANEOUS
  Filled 2019-05-11 (×3): qty 0.4

## 2019-05-11 MED ORDER — EPHEDRINE 5 MG/ML INJ
INTRAVENOUS | Status: AC
  Filled 2019-05-11: qty 20

## 2019-05-11 MED ORDER — 0.9 % SODIUM CHLORIDE (POUR BTL) OPTIME
TOPICAL | Status: DC | PRN
  Administered 2019-05-11: 1000 mL

## 2019-05-11 MED ORDER — VANCOMYCIN HCL 1000 MG IV SOLR
INTRAVENOUS | Status: AC
  Filled 2019-05-11: qty 1000

## 2019-05-11 SURGICAL SUPPLY — 85 items
BIT DRILL 4.3 (BIT) ×2 IMPLANT
BIT DRILL 4.3MM (BIT) ×1
BIT DRILL 4.3X300MM (BIT) IMPLANT
BIT DRILL LONG 3.3 (BIT) ×1 IMPLANT
BIT DRILL LONG 3.3MM (BIT) ×1
BIT DRILL NCB-PP FEMUR MIS 3 (DRILL) IMPLANT
BIT DRILL QC 3.3X195 (BIT) ×2 IMPLANT
BLADE CLIPPER SURG (BLADE) IMPLANT
BNDG COHESIVE 6X5 TAN STRL LF (GAUZE/BANDAGES/DRESSINGS) ×3 IMPLANT
BNDG ELASTIC 6X10 VLCR STRL LF (GAUZE/BANDAGES/DRESSINGS) ×3 IMPLANT
BRUSH SCRUB EZ PLAIN DRY (MISCELLANEOUS) ×4 IMPLANT
CANISTER SUCT 3000ML PPV (MISCELLANEOUS) ×3 IMPLANT
CAP LOCK NCB (Cap) ×20 IMPLANT
CHLORAPREP W/TINT 26 (MISCELLANEOUS) ×5 IMPLANT
CNTNR URN SCR LID CUP LEK RST (MISCELLANEOUS) IMPLANT
CONT SPEC 4OZ STRL OR WHT (MISCELLANEOUS) ×3
COVER SURGICAL LIGHT HANDLE (MISCELLANEOUS) ×3 IMPLANT
COVER WAND RF STERILE (DRAPES) ×1 IMPLANT
DERMABOND ADVANCED (GAUZE/BANDAGES/DRESSINGS) ×2
DERMABOND ADVANCED .7 DNX12 (GAUZE/BANDAGES/DRESSINGS) IMPLANT
DRAPE C-ARM 42X72 X-RAY (DRAPES) ×3 IMPLANT
DRAPE C-ARMOR (DRAPES) ×3 IMPLANT
DRAPE HALF SHEET 40X57 (DRAPES) ×4 IMPLANT
DRAPE ORTHO SPLIT 77X108 STRL (DRAPES) ×6
DRAPE SURG 17X23 STRL (DRAPES) ×3 IMPLANT
DRAPE SURG ORHT 6 SPLT 77X108 (DRAPES) ×2 IMPLANT
DRAPE U-SHAPE 47X51 STRL (DRAPES) ×3 IMPLANT
DRILL BIT 4.3 (BIT) ×2
DRILL NCB-PP FEMUR MIS 3 (DRILL) ×3
DRSG ADAPTIC 3X8 NADH LF (GAUZE/BANDAGES/DRESSINGS) IMPLANT
DRSG MEPILEX BORDER 4X12 (GAUZE/BANDAGES/DRESSINGS) IMPLANT
DRSG MEPILEX BORDER 4X4 (GAUZE/BANDAGES/DRESSINGS) ×2 IMPLANT
DRSG MEPILEX BORDER 4X8 (GAUZE/BANDAGES/DRESSINGS) ×4 IMPLANT
DRSG PAD ABDOMINAL 8X10 ST (GAUZE/BANDAGES/DRESSINGS) ×3 IMPLANT
ELECT REM PT RETURN 9FT ADLT (ELECTROSURGICAL) ×3
ELECTRODE REM PT RTRN 9FT ADLT (ELECTROSURGICAL) ×1 IMPLANT
GAUZE SPONGE 4X4 12PLY STRL (GAUZE/BANDAGES/DRESSINGS) ×1 IMPLANT
GLOVE BIO SURGEON STRL SZ 6.5 (GLOVE) ×5 IMPLANT
GLOVE BIO SURGEON STRL SZ7.5 (GLOVE) ×10 IMPLANT
GLOVE BIO SURGEONS STRL SZ 6.5 (GLOVE) ×2
GLOVE BIOGEL PI IND STRL 6.5 (GLOVE) ×1 IMPLANT
GLOVE BIOGEL PI IND STRL 7.5 (GLOVE) ×1 IMPLANT
GLOVE BIOGEL PI INDICATOR 6.5 (GLOVE) ×2
GLOVE BIOGEL PI INDICATOR 7.5 (GLOVE) ×2
GOWN STRL REUS W/ TWL LRG LVL3 (GOWN DISPOSABLE) ×3 IMPLANT
GOWN STRL REUS W/TWL LRG LVL3 (GOWN DISPOSABLE) ×9
K-WIRE 2.0 (WIRE) ×2
K-WIRE FXSTD 280X2XNS SS (WIRE) ×1
KIT BASIN OR (CUSTOM PROCEDURE TRAY) ×3 IMPLANT
KIT TURNOVER KIT B (KITS) ×3 IMPLANT
KWIRE FXSTD 280X2XNS SS (WIRE) IMPLANT
NS IRRIG 1000ML POUR BTL (IV SOLUTION) ×3 IMPLANT
PACK TOTAL JOINT (CUSTOM PROCEDURE TRAY) ×3 IMPLANT
PAD ARMBOARD 7.5X6 YLW CONV (MISCELLANEOUS) ×6 IMPLANT
PAD CAST 4YDX4 CTTN HI CHSV (CAST SUPPLIES) ×1 IMPLANT
PADDING CAST COTTON 4X4 STRL (CAST SUPPLIES) ×3
PADDING CAST COTTON 6X4 STRL (CAST SUPPLIES) ×3 IMPLANT
PLATE FEM DIST NCB PP 278MM (Plate) ×2 IMPLANT
SCREW 5.0 80MM (Screw) ×6 IMPLANT
SCREW CORT NCB SELFTAP 5.0X50 (Screw) ×2 IMPLANT
SCREW CORTICAL NCB 5.0X65 (Screw) ×2 IMPLANT
SCREW HUM NCB PA ST 4X60 (Screw) ×2 IMPLANT
SCREW NCB 3.5X75X5X6.2XST (Screw) IMPLANT
SCREW NCB 4.0 32MM (Screw) ×2 IMPLANT
SCREW NCB 4.0MX34M (Screw) ×2 IMPLANT
SCREW NCB 4.0MX46M (Screw) ×2 IMPLANT
SCREW NCB 5.0X10 (Screw) ×4 IMPLANT
SCREW NCB 5.0X75MM (Screw) ×3 IMPLANT
SCREW UNI CORTICAL 5.0X14MM (Screw) ×3 IMPLANT
SCREW UNICORTICAL 5.0X14 (Screw) IMPLANT
SPONGE LAP 18X18 RF (DISPOSABLE) IMPLANT
STAPLER VISISTAT 35W (STAPLE) ×1 IMPLANT
SUCTION FRAZIER HANDLE 10FR (MISCELLANEOUS) ×2
SUCTION TUBE FRAZIER 10FR DISP (MISCELLANEOUS) ×1 IMPLANT
SUT ETHILON 3 0 PS 1 (SUTURE) ×6 IMPLANT
SUT MNCRL AB 3-0 PS2 18 (SUTURE) ×4 IMPLANT
SUT VIC AB 0 CT1 27 (SUTURE)
SUT VIC AB 0 CT1 27XBRD ANBCTR (SUTURE) IMPLANT
SUT VIC AB 1 CT1 27 (SUTURE)
SUT VIC AB 1 CT1 27XBRD ANBCTR (SUTURE) IMPLANT
SUT VIC AB 2-0 CT1 27 (SUTURE) ×3
SUT VIC AB 2-0 CT1 TAPERPNT 27 (SUTURE) ×2 IMPLANT
TOWEL GREEN STERILE (TOWEL DISPOSABLE) ×4 IMPLANT
TRAY FOLEY MTR SLVR 16FR STAT (SET/KITS/TRAYS/PACK) IMPLANT
WATER STERILE IRR 1000ML POUR (IV SOLUTION) ×2 IMPLANT

## 2019-05-11 NOTE — Transfer of Care (Signed)
Immediate Anesthesia Transfer of Care Note  Patient: Yesenia Sullivan  Procedure(s) Performed: OPEN REDUCTION INTERNAL FIXATION (ORIF) DISTAL FEMUR FRACTURE (Right Leg Upper)  Patient Location: PACU  Anesthesia Type:General  Level of Consciousness: awake  Airway & Oxygen Therapy: Patient Spontanous Breathing and Patient connected to nasal cannula oxygen  Post-op Assessment: Report given to RN and Post -op Vital signs reviewed and stable  Post vital signs: Reviewed and stable  Last Vitals:  Vitals Value Taken Time  BP 117/60 05/11/19 1424  Temp    Pulse 73 05/11/19 1426  Resp 10 05/11/19 1426  SpO2 98 % 05/11/19 1426  Vitals shown include unvalidated device data.  Last Pain:  Vitals:   05/11/19 1115  TempSrc: Oral  PainSc: 5       Patients Stated Pain Goal: 3 ([id number])  Complications: No apparent anesthesia complications

## 2019-05-11 NOTE — Plan of Care (Signed)

## 2019-05-11 NOTE — Progress Notes (Signed)
Patient report given both to short stay at [facility], and [facility]. 1 mg dilaudid repeated per Dr. Pietro Cassis verbal order, r/t patient transport. Reassurance given to patient.

## 2019-05-11 NOTE — Progress Notes (Signed)
PROGRESS NOTE  Jasslyn Vietti  DOB: Apr 02, 1945  PCP: Patient, No Pcp Per [id number]:[id number]  DOA: 05/10/2019 Admitted From: Home  LOS: 1 day   Chief Complaint  Patient presents with  . Leg Pain  . Leg Swelling   Brief narrative: Briggett Majewski is a 74 y.o. female with no significant PMH and no medicines at home except for multivitamins.  Patient presented to the ED on 3/23 with right femur fracture.   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 She noticed a right breast lump 1 year ago.  She did not seek any medical opinion and says she was '' trying to pray this away." 4 to 6 weeks ago, patient started feeling fatigue and intermittent pain in her neck, right arm and hip.  She started having weakness in her thigh muscles which became progressively worse. On 3/17, patient came to the ED.  MRI brain was done at that time which showed findings suspicious of osseous metastatic disease.   According to documentation from that visit, patient showed strong preference to be discharged and follow-up as an outpatient.  She was asked to establish a primary care provider. She saw a PCP at [facility] on 3/23.  She was referred to breast clinic for further evaluation.  On her way out of the PCP's office, patient suddenly felt a pop on her right thigh followed by severe pain and obvious deformity. EMS was called and patient was sent to the ED.  In the ED, patient is afebrile, heart rate in the 80s, blood pressure 148/104, oxygen saturation 1 to 90% on room air. Labs showed sodium 139, potassium 2.9, BUN/creatinine 27/0.74, WBC count 11.2, hemoglobin 12.6, platelet 180. Respiratory panel is negative for COVID. Right femur x-ray showed severely displaced and angulated distal right femoral shaft fracture. Orthopedic surgery consultation was done.  Patient is tentatively planned for surgical fixation tomorrow. Patient was  admitted to hospitalist service for further evaluation and management.  Subjective: Patient was seen and examined this morning.  Pain controlled overnight.  This morning he is having excruciating pain.  I asked nurse to give her extra dose of Dilaudid today.  Patient also had acute urinary retention overnight requiring in and out catheterization.  I decided to put in a Foley catheter today. I will restart relatively stable.  I asked patient to be transferred to [facility] for surgery.  Patient will stay there after surgery.  Assessment/Plan: Right femur shaft fracture Pathological fracture -Per history, patient had a fracture without a fall or getting any impact at the site.  Unclear if she has metastasis at the fracture site as well -May have some degree of osteoporosis as well -Orthopedics plans to do ORIF today. -Pain control with IV Dilaudid.  I increased frequency this morning because of excruciating nature of pain.  Needs frequent reassessment post procedure.  Acute urinary retention -Patient had acute urinary retention overnight requiring in and out catheterization.  I decided to put in a Foley catheter today.  To be removed post procedure  Elevated blood pressure -No documented history of hypertension.  Not on medicines at home.   -Initial blood pressure readings elevated likely the pain.  Continue to monitor.  condary to pain.   -IV hydralazine as needed ordered.  Preoperative risk assessment -As mentioned above, no documented history of cardiac risk factors.  Before her last visit in the ED, she had not  seen a physician in several years. -She got right hip replacement at the age of 75 and did not have any perioperative complication. -Currently no cardiac symptoms.  EKG with normal sinus rhythm at 65 bpm with no ST-T wave changes or QTC prolongation. -At this time, patient is at acceptable risk for planned procedure.  Right breast lump Possible osseous metastasis -High  degree of suspicion of breast cancer with metastasis. -MRI brain from 3/17 showed 13 mm focus of T2 and diffusion-weighted signal hyperintensity within the right frontoparietal calvarium. There is also abnormal T1 hypointense marrow signal within portions of the visualized upper cervical spine with relative sparing of the C3 vertebra. Findings are indeterminate and osseous metastatic disease cannot be excluded.  -Patient was seen by PCP on 3/23 and referred to breast center.  Needs biopsy done and subsequent oncology evaluation as an outpatient.  DVT prophylaxis: SCDs for now.  Lovenox/heparin post procedure Antimicrobials: None Fluid:  LR at 75 mill per hour Diet:  Remains n.p.o. for procedure today.   Code Status: Full code Mobility: PT evaluation post procedure Family Communication: Patient is self updating her friend. Discharge plan:  Anticipated date and disposition: Surgery today.  Home versus rehab post procedure  Consultants: Orthopedics  Antimicrobials: Anti-infectives (From admission, onward)   None        Code Status: Full Code   Diet Order            Diet NPO time specified  Diet effective now              Infusions:  . lactated ringers      Scheduled Meds: . senna  1 tablet Oral BID  . sodium chloride flush  3 mL Intravenous Q12H    PRN meds: acetaminophen **OR** acetaminophen, hydrALAZINE, HYDROmorphone (DILAUDID) injection, magnesium hydroxide, ondansetron **OR** ondansetron (ZOFRAN) IV, oxyCODONE, sodium phosphate, sorbitol   Objective: Vitals:   05/10/19 2023 05/11/19 0635  BP: 131/71 126/73  Pulse: 70 70  Resp: 16 14  Temp: 98.6 F (37 C) 98.6 F (37 C)  SpO2: 94% 94%    Intake/Output Summary (Last 24 hours) at 05/11/2019 1101 Last data filed at 05/11/2019 1000 Gross per 24 hour  Intake 363 ml  Output 0 ml  Net 363 ml   Filed Weights   05/10/19 1416  Weight: 74.4 kg   Weight change:  Body mass index is 29.52 kg/m.    Physical Exam: General exam: Appears calm and comfortable.  Intermittent excruciating pain Skin: No rashes, lesions or ulcers. HEENT: Atraumatic, normocephalic, supple neck, no obvious bleeding Lungs: Clear to auscultation bilaterally CVS: Regular rate and rhythm, no murmur GI/Abd soft, nontender, nondistended, bowel sound present CNS: Alert, awake monitor x3 Psychiatry: Mood appropriate Extremities: No pedal edema, no calf tenderness, right thigh swollen  Data Review: I have personally reviewed the laboratory data and studies available.  Recent Labs  Lab 05/04/19 1223 05/10/19 1530 05/11/19 0333  WBC 7.2 11.2* 8.8  NEUTROABS  --  10.1*  --   HGB 13.2 12.6 12.1  HCT 39.4 38.2 37.2  MCV 90.0 90.7 92.5  PLT 161 180 159   Recent Labs  Lab 05/04/19 1223 05/10/19 1530 05/11/19 0333  NA 142 139 139  K 3.6 3.9 4.0  CL 107 102 104  CO2 25 25 26   GLUCOSE 99 130* 91  BUN 30* 27* 31*  CREATININE 0.80 0.74 0.96  CALCIUM 9.2 9.1 9.0    Signed, Terrilee Croak, MD [facility] Pager: [phone] (Secure  Chat preferred). 05/11/2019

## 2019-05-11 NOTE — Progress Notes (Signed)
Patient did not void last night- information received in report. Patient was bladder scanned prior to this shoft. Foley ordered and inserted. Clear yellow urine returned. Leg strap secure for foley. Call bell within reach.

## 2019-05-11 NOTE — Anesthesia Preprocedure Evaluation (Addendum)
Anesthesia Evaluation  Patient identified by MRN, date of birth, ID band Patient awake    Reviewed: Allergy & Precautions, NPO status , Patient's Chart, lab work & pertinent test results  History of Anesthesia Complications Negative for: history of anesthetic complications  Airway Mallampati: II  TM Distance: >3 FB Neck ROM: Full    Dental no notable dental hx.    Pulmonary neg pulmonary ROS,    Pulmonary exam normal        Cardiovascular negative cardio ROS Normal cardiovascular exam     Neuro/Psych negative neurological ROS  negative psych ROS   GI/Hepatic negative GI ROS, Neg liver ROS,   Endo/Other  negative endocrine ROS  Renal/GU negative Renal ROS  negative genitourinary   Musculoskeletal Right periprosthetic femur fracture   Abdominal   Peds  Hematology negative hematology ROS (+)   Anesthesia Other Findings Day of surgery medications reviewed with patient.  Reproductive/Obstetrics negative OB ROS                            Anesthesia Physical Anesthesia Plan  ASA: II  Anesthesia Plan: General   Post-op Pain Management:    Induction: Intravenous  PONV Risk Score and Plan: 4 or greater and Treatment may vary due to age or medical condition, Ondansetron and Dexamethasone  Airway Management Planned: Oral ETT  Additional Equipment: None  Intra-op Plan:   Post-operative Plan: Extubation in OR  Informed Consent: I have reviewed the patients History and Physical, chart, labs and discussed the procedure including the risks, benefits and alternatives for the proposed anesthesia with the patient or authorized representative who has indicated his/her understanding and acceptance.     Dental advisory given  Plan Discussed with: CRNA  Anesthesia Plan Comments:        Anesthesia Quick Evaluation

## 2019-05-11 NOTE — Consult Note (Signed)
Orthopaedic Trauma Service (OTS) Consult   Patient ID: Yesenia Sullivan MRN: [id number] DOB/AGE: 1945/04/21 74 y.o.  Reason for Consult:Right femur fracture Referring Physician: Dr. Terrilee Croak, MD [facility]  HPI: Yesenia Sullivan is an 74 y.o. female who is being seen in consultation at the request of Dr. Pietro Cassis for evaluation of right femur fracture.  The patient has had an increased amount of thigh pain bilaterally for the last 3 weeks that has required her to ambulate with the use of a walker.  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  She had a breast mass that she has not had it evaluated recently.  She was walking up her steps when she felt a pop in her leg gave out she had immediate pain and deformity and was brought to the emergency room where x-rays showed a distal femur fracture.  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  This has been functioning well.  Patient was seen in the preoperative holding area.  Denies any numbness or tingling.  Does note some left thigh pain and bilateral upper extremity pain.  She lives alone but does have a friend that can assist.  She has no local family in town.  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  She has significant pain with any attempted range of motion or movement of the leg.  Past Medical History:  Diagnosis Date  . URI (upper respiratory infection)     Past Surgical History:  Procedure Laterality Date  . ABDOMINAL HYSTERECTOMY    . HIP SURGERY      History reviewed. No pertinent family history.  Social History:  reports that she has never smoked. She has never used smokeless tobacco. She reports that she does not drink alcohol or use drugs.  Allergies:  Allergies  Allergen Reactions  . Codeine Nausea And Vomiting  . Penicillins Other (See Comments)    Causes skin rash     Medications:  No current facility-administered  medications on file prior to encounter.   Current Outpatient Medications on File Prior to Encounter  Medication Sig Dispense Refill  . acetaminophen (TYLENOL) 325 MG tablet Take 325 mg by mouth every 6 (six) hours as needed for mild pain or headache.    . Cholecalciferol (VITAMIN D3 PO) Take 1 capsule by mouth daily.    Marland Kitchen ibuprofen (ADVIL) 200 MG tablet Take 200 mg by mouth every 6 (six) hours as needed for headache or moderate pain.    Marland Kitchen MAGNESIUM PO Take 1 tablet by mouth daily.    . Multiple Vitamin (MULTIVITAMIN WITH MINERALS) TABS tablet Take 1 tablet by mouth daily.    . predniSONE (DELTASONE) 10 MG tablet Take 2 tablets (20 mg total) by mouth daily with breakfast. Take 2 pills a day for 10 days then decrease to 1 pill for 2 days and then 1/2 pill for 2 days. 23 tablet 0    ROS: Constitutional: No fever or chills Vision: No changes in vision ENT: No difficulty swallowing CV: No chest pain Pulm: No SOB or wheezing GI: No nausea or vomiting GU: No urgency or inability to hold urine Skin: No poor wound healing Neurologic: No numbness or tingling Psychiatric: No depression or anxiety Heme: No bruising Allergic: No reaction to medications or food   Exam: Blood pressure (!) 143/82, pulse 82, temperature 99.9 F (37.7 C), temperature source Oral, resp.  rate 16, height 5' 2.75" (1.594 m), weight 73.9 kg, SpO2 94 %. General: No acute distress Orientation: Awake alert and oriented x3 Mood and Affect: Cooperative and pleasant Gait: Unable to assess due to her fracture Coordination and balance: Within normal limits  Right lower extremity: Obvious deformity.  No skin lesions.  Unable to tolerate any range of motion.  Compartments are soft compressible.  Active dorsiflexion plantarflexion of the foot and ankle.  Sensation is grossly intact to light touch the dorsum and plantar aspect of her foot.  She has 2+ DP pulses with warm well-perfused foot.  Reflexes are within normal  limits.  Left lower extremity: Skin without lesions. No tenderness to palpation. Full painless ROM, full strength in each muscle groups without evidence of instability.  Bilateral upper extremities reveal full range of motion although somewhat weak with the shoulder abduction.  She has full strength with grip and interosseous and wrist extension and elbow flexion extension.  No instability noted about the arms or elbows or shoulders.  No notable tenderness to palpation.   Medical Decision Making: Data: Imaging: X-rays of the right femur and right hip show a previous total hip arthroplasty that is well seated without any signs of loosening or failure.  There is a transverse distal femoral shaft fracture with no apparent intra-articular involvement.  I do not appreciate any blastic or lytic lesions on x-rays however it is shortened and overriding so is difficult to fully appreciate.  Labs:  Results for orders placed or performed during the hospital encounter of 05/10/19 (from the past 48 hour(s))  CBC with Differential     Status: Abnormal   Collection Time: 05/10/19  3:30 PM  Result Value Ref Range   WBC 11.2 (H) 4.0 - 10.5 K/uL   RBC 4.21 3.87 - 5.11 MIL/uL   Hemoglobin 12.6 12.0 - 15.0 g/dL   HCT 38.2 36.0 - 46.0 %   MCV 90.7 80.0 - 100.0 fL   MCH 29.9 26.0 - 34.0 pg   MCHC 33.0 30.0 - 36.0 g/dL   RDW 12.9 11.5 - 15.5 %   Platelets 180 150 - 400 K/uL   nRBC 0.0 0.0 - 0.2 %   Neutrophils Relative % 89 %   Neutro Abs 10.1 (H) 1.7 - 7.7 K/uL   Lymphocytes Relative 6 %   Lymphs Abs 0.6 (L) 0.7 - 4.0 K/uL   Monocytes Relative 4 %   Monocytes Absolute 0.4 0.1 - 1.0 K/uL   Eosinophils Relative 0 %   Eosinophils Absolute 0.0 0.0 - 0.5 K/uL   Basophils Relative 0 %   Basophils Absolute 0.0 0.0 - 0.1 K/uL   Immature Granulocytes 1 %   Abs Immature Granulocytes 0.07 0.00 - 0.07 K/uL    Comment: Performed at [facility], [place] [address], [place], [place] [address]   Comprehensive metabolic panel     Status: Abnormal   Collection Time: 05/10/19  3:30 PM  Result Value Ref Range   Sodium 139 135 - 145 mmol/L   Potassium 3.9 3.5 - 5.1 mmol/L   Chloride 102 98 - 111 mmol/L   CO2 25 22 - 32 mmol/L   Glucose, Bld 130 (H) 70 - 99 mg/dL    Comment: Glucose reference range applies only to samples taken after fasting for at least 8 hours.   BUN 27 (H) 8 - 23 mg/dL   Creatinine, Ser 0.74 0.44 - 1.00 mg/dL   Calcium 9.1 8.9 - 10.3 mg/dL   Total  Protein 7.6 6.5 - 8.1 g/dL   Albumin 4.4 3.5 - 5.0 g/dL   AST 51 (H) 15 - 41 U/L   ALT 20 0 - 44 U/L   Alkaline Phosphatase 168 (H) 38 - 126 U/L   Total Bilirubin 0.6 0.3 - 1.2 mg/dL   GFR calc non Af Amer >60 >60 mL/min   GFR calc Af Amer >60 >60 mL/min   Anion gap 12 5 - 15    Comment: Performed at [facility], [place] [address] [place], [place] [address]  Respiratory Panel by RT PCR (Flu A&B, Covid) - Nasopharyngeal Swab     Status: None   Collection Time: 05/10/19  4:46 PM   Specimen: Nasopharyngeal Swab  Result Value Ref Range   SARS Coronavirus 2 by RT PCR NEGATIVE NEGATIVE    Comment: (NOTE) SARS-CoV-2 target nucleic acids are NOT DETECTED. The SARS-CoV-2 RNA is generally detectable in upper respiratoy specimens during the acute phase of infection. The lowest concentration of SARS-CoV-2 viral copies this assay can detect is 131 copies/mL. A negative result does not preclude SARS-Cov-2 infection and should not be used as the sole basis for treatment or other patient management decisions. A negative result may occur with  improper specimen collection/handling, submission of specimen other than nasopharyngeal swab, presence of viral mutation(s) within the areas targeted by this assay, and inadequate number of viral copies (<131 copies/mL). A negative result must be combined with clinical observations, patient history, and epidemiological information. The expected result is  Negative. Fact Sheet for Patients:  PinkCheek.be Fact Sheet for Healthcare Providers:  GravelBags.it This test is not yet ap proved or cleared by the [place] FDA and  has been authorized for detection and/or diagnosis of SARS-CoV-2 by FDA under an Emergency Use Authorization (EUA). This EUA will remain  in effect (meaning this test can be used) for the duration of the COVID-19 declaration under Section 564(b)(1) of the Act, 21 U.S.C. section 360bbb-3(b)(1), unless the authorization is terminated or revoked sooner.    Influenza A by PCR NEGATIVE NEGATIVE   Influenza B by PCR NEGATIVE NEGATIVE    Comment: (NOTE) The Xpert Xpress SARS-CoV-2/FLU/RSV assay is intended as an aid in  the diagnosis of influenza from Nasopharyngeal swab specimens and  should not be used as a sole basis for treatment. Nasal washings and  aspirates are unacceptable for Xpert Xpress SARS-CoV-2/FLU/RSV  testing. Fact Sheet for Patients: PinkCheek.be Fact Sheet for Healthcare Providers: GravelBags.it This test is not yet approved or cleared by the [place] FDA and  has been authorized for detection and/or diagnosis of SARS-CoV-2 by  FDA under an Emergency Use Authorization (EUA). This EUA will remain  in effect (meaning this test can be used) for the duration of the  Covid-19 declaration under Section 564(b)(1) of the Act, 21  U.S.C. section 360bbb-3(b)(1), unless the authorization is  terminated or revoked. Performed at [facility], [place] [address] [place], [place] [id number]   Basic metabolic panel     Status: Abnormal   Collection Time: 05/11/19  3:33 AM  Result Value Ref Range   Sodium 139 135 - 145 mmol/L   Potassium 4.0 3.5 - 5.1 mmol/L   Chloride 104 98 - 111 mmol/L   CO2 26 22 - 32 mmol/L   Glucose, Bld 91 70 - 99 mg/dL    Comment: Glucose reference range  applies only to samples taken after fasting for at least 8 hours.   BUN 31 (H)  8 - 23 mg/dL   Creatinine, Ser 0.96 0.44 - 1.00 mg/dL   Calcium 9.0 8.9 - 10.3 mg/dL   GFR calc non Af Amer 58 (L) >60 mL/min   GFR calc Af Amer >60 >60 mL/min   Anion gap 9 5 - 15    Comment: Performed at [facility], [place] [address], [place], [place] [address]  CBC     Status: None   Collection Time: 05/11/19  3:33 AM  Result Value Ref Range   WBC 8.8 4.0 - 10.5 K/uL   RBC 4.02 3.87 - 5.11 MIL/uL   Hemoglobin 12.1 12.0 - 15.0 g/dL   HCT 37.2 36.0 - 46.0 %   MCV 92.5 80.0 - 100.0 fL   MCH 30.1 26.0 - 34.0 pg   MCHC 32.5 30.0 - 36.0 g/dL   RDW 13.2 11.5 - 15.5 %   Platelets 159 150 - 400 K/uL   nRBC 0.0 0.0 - 0.2 %    Comment: Performed at [facility], [place] [address], [place], [place] [address]    Imaging or Labs ordered: None  Medical history and chart was reviewed and case discussed with medical provider.  Assessment/Plan: 74 year old female with no significant past medical history with likely pathologic right distal femur fracture.  I discussed at length risks and benefits of proceeding with surgical intervention.  My main concern is that there is a pathologic femur fracture.  She had an MRI that was concerning for some lesions in her skull.  She is complaining of bilateral shoulder pain as well as left thigh pain which will need to radiograph after her fixation of her femur.  I will evaluate intraoperatively to see if there is any visible lesion if there is I will attempt to biopsy this.  On plain radiographs from her injury I do not appreciate anything.  She will likely need a metastatic work-up postoperatively including imaging of her left femur and bilateral upper extremities.   Shona Needles, MD [facility] [phone] (office) [email or web address]

## 2019-05-11 NOTE — Anesthesia Procedure Notes (Signed)
Procedure Name: Intubation Performed by: Chenise Mulvihill H, CRNA Pre-anesthesia Checklist: Patient identified, Emergency Drugs available, Suction available and Patient being monitored Patient Re-evaluated:Patient Re-evaluated prior to induction Oxygen Delivery Method: Circle System Utilized Preoxygenation: Pre-oxygenation with 100% oxygen Induction Type: IV induction Ventilation: Mask ventilation without difficulty Laryngoscope Size: Mac and 3 Grade View: Grade I Tube type: Oral Tube size: 7.0 mm Number of attempts: 1 Airway Equipment and Method: Stylet and Oral airway Placement Confirmation: ETT inserted through vocal cords under direct vision,  positive ETCO2 and breath sounds checked- equal and bilateral Secured at: 21 cm Tube secured with: Tape Dental Injury: Teeth and Oropharynx as per pre-operative assessment        

## 2019-05-11 NOTE — Plan of Care (Signed)

## 2019-05-11 NOTE — Op Note (Signed)
Orthopaedic Surgery Operative Note (CSN: [id number] ) Date of Surgery: 05/11/2019  Admit Date: 05/10/2019   Diagnoses: Pre-Op Diagnoses: Right pathologic femur fracture   Post-Op Diagnosis: Same  Procedures: 1. CPT 27511-Open reduction internal fixation of right femoral shaft fracture 2. CPT 20245-Deep bone biopsy of right femur  Surgeons : Primary: Shona Needles, MD  Assistant: Patrecia Pace, PA-C  Location: OR 7   Anesthesia:General  Antibiotics: Ancef 2g preop with 1 gm vancomycin powder topically   Tourniquet time:None    Estimated Blood 0000000 mL  Complications:None   Specimens: ID Type Source Tests Collected by Time Destination  1 : right femur bone lesion Tissue PATH Other SURGICAL PATHOLOGY Shona Needles, MD 05/11/2019 1257      Implants: Implant Name Type Inv. Item Serial No. Manufacturer Lot No. LRB No. Used Action  SCREW NCB 4.WD:3202005 [id number] Screw SCREW NCB 4.WD:3202005  [employer](ORTH,TRAU,BIO,SG)  Right 1 Implanted  SCREW NCB 4.0MX34M - OF:[id number] Screw SCREW NCB 4.0MX34M  [employer](ORTH,TRAU,BIO,SG)  Right 1 Implanted  SCREW NCB 4.0 32MM - OF:[id number] Screw SCREW NCB 4.0 32MM  [employer](ORTH,TRAU,BIO,SG)  Right 1 Implanted  SCREW 5.0 80MM - OF:[id number] Screw SCREW 5.0 80MM  [employer](ORTH,TRAU,BIO,SG)  Right 2 Implanted  SCREW NCB 5.0X75MM - OF:[id number] Screw SCREW NCB 5.0X75MM  [employer](ORTH,TRAU,BIO,SG)  Right 1 Implanted  SCREW CORT NCB SELFTAP 5.0X50 - OF:[id number] Screw SCREW CORT NCB SELFTAP 5.0X50  [employer](ORTH,TRAU,BIO,SG)  Right 1 Implanted  SCREW CORTICAL NCB 5.0X65 - OF:[id number] Screw SCREW CORTICAL NCB 5.0X65  [employer](ORTH,TRAU,BIO,SG)  Right 1 Implanted  SCREW NCB 5.0X10 - OF:[id number] Screw SCREW NCB 5.0X10  [employer](ORTH,TRAU,BIO,SG)  Right 2 Implanted  CAP LOCK NCB - OF:[id number] Cap CAP LOCK NCB  [employer](ORTH,TRAU,BIO,SG)  Right 10 Implanted  PLATE FEM DIST NCB PP 278MM - OF:[id number] Plate PLATE FEM DIST NCB PP 278MM   [employer](ORTH,TRAU,BIO,SG)  Right 1 Implanted     Indications for Surgery: 74 year old female with no significant past medical history that has had progressive worsening weightbearing pain in her bilateral thighs and sustained a pathologic right femur fracture.  Due to the displacement and unstable nature of her injury I recommended open reduction internal fixation.  Risks and benefits were discussed with the patient.  Risks include but not limited to bleeding, infection, malunion, nonunion, hardware failure, hardware irritation, nerve and blood vessel injury, DVT, even the possibility anesthetic complications.  Patient agreed to proceed with surgery and consent was obtained.   Operative Findings: 1.  Lytic lesion in the distal femoral metaphysis that was biopsied and sent to pathology 2.  Open reduction internal fixation of right distal femoral shaft fracture using [employer] NCB 12 hole distal femoral locking plate  Procedure: The patient was identified in the preoperative holding area. Consent was confirmed with the patient and their family and all questions were answered. The operative extremity was marked after confirmation with the patient. she was then brought back to the operating room by our anesthesia colleagues.  She was placed under general anesthetic and a Foley catheter was placed.  She was carefully transferred over to a radiolucent flat top table.  A bump was placed under her operative hip.  The right lower extremity was prepped and draped in usual sterile fashion.  Timeout was performed to verify the patient, the procedure, and the extremity.  Preoperative antibiotics were dosed.  Fluoroscopic imaging was obtained and it appeared that she had a lytic lesion in the distal femoral metaphysis.  It  appeared that it had eroded away the medial cortex.  I proceeded by performing a reduction maneuver over a triangle to line up the femoral shaft fracture.  I then made a lateral incision  to the distal femur carried it through skin and subcutaneous tissue.  I then used a 4.3 mm drill bit to make a corticotomy in the distal femoral metaphysis on the lateral side.  I then used a pituitary ronguer to grasp tissue from the lytic lesion and sent this for pathology.  Once the biopsy was performed I then slid a 12 hole NCB distal femoral locking plate submuscularly along the lateral cortex.  I used a percutaneously placed 3.3 mm drill bit into the proximal femoral shaft to hold the proximal portion of the plate.  I confirmed alignment with fluoroscopy and then proceeded to place a K wire distally position of the plate.  A 3.3 mm drill bit was used in the distal femoral metaphysis to bring the distal portion plate flush to bone.  The process was repeated in the femoral shaft.  Alignment was maintained.  I then placed another nonlocking screw into the distal segment and a 4.0 mm nonlocking screw in the proximal segment.  Once I had provisional fixation distally and proximally I proceeded to place 5.0 millimeter screws in the distal segment in the medial and lateral femoral condyle.  I then placed another nonlocking screw into the proximal shaft segment.  I placed locking caps on the proximal 2 bicortical screws in the femoral shaft.  I then drilled and placed 2 unicortical screws to bridge the prosthesis.  Locking caps were placed on the unicortical screws.  Locking caps were placed on all the most distal screws of the articular block.  The jig was removed.  Final fluoroscopic imaging was obtained.  The incision was copiously irrigated.  A gram of vancomycin powder was placed into the incision.  A layer closure consisting of 0 Vicryl, 2-0 Vicryl and 3-0 Monocryl was used.  The patient was awoken from anesthesia and taken the PACU in stable condition.  Post Op Plan/Instructions: Patient may be touchdown weightbearing to the right lower extremity.  She will receive postoperative Ancef.  She will be  started on Lovenox for DVT prophylaxis recommend x-rays of her left femur and bilateral humerus is to evaluate for lytic lesions in those bones.  She should have a work-up from the hospitalist in regards to her possible cancer including a chest abdomen pelvis CT scan.  She will mobilize with physical and Occupational Therapy.  I was present and performed the entire surgery.  Patrecia Pace, PA-C did assist me throughout the case. An assistant was necessary given the difficulty in approach, maintenance of reduction and ability to instrument the fracture.   Katha Hamming, MD [facility]

## 2019-05-12 ENCOUNTER — Inpatient Hospital Stay (HOSPITAL_COMMUNITY): Payer: PPO

## 2019-05-12 ENCOUNTER — Encounter: Payer: Self-pay | Admitting: *Deleted

## 2019-05-12 DIAGNOSIS — Z8781 Personal history of (healed) traumatic fracture: Secondary | ICD-10-CM

## 2019-05-12 DIAGNOSIS — S72301A Unspecified fracture of shaft of right femur, initial encounter for closed fracture: Secondary | ICD-10-CM

## 2019-05-12 DIAGNOSIS — S72001A Fracture of unspecified part of neck of right femur, initial encounter for closed fracture: Secondary | ICD-10-CM

## 2019-05-12 DIAGNOSIS — W19XXXD Unspecified fall, subsequent encounter: Secondary | ICD-10-CM

## 2019-05-12 DIAGNOSIS — Z9889 Other specified postprocedural states: Secondary | ICD-10-CM

## 2019-05-12 LAB — HEPATIC FUNCTION PANEL
ALT: 68 U/L — ABNORMAL HIGH (ref 0–44)
AST: 93 U/L — ABNORMAL HIGH (ref 15–41)
Albumin: 3.3 g/dL — ABNORMAL LOW (ref 3.5–5.0)
Alkaline Phosphatase: 161 U/L — ABNORMAL HIGH (ref 38–126)
Bilirubin, Direct: 0.2 mg/dL (ref 0.0–0.2)
Indirect Bilirubin: 0.3 mg/dL (ref 0.3–0.9)
Total Bilirubin: 0.5 mg/dL (ref 0.3–1.2)
Total Protein: 6.3 g/dL — ABNORMAL LOW (ref 6.5–8.1)

## 2019-05-12 LAB — CBC
HCT: 32.1 % — ABNORMAL LOW (ref 36.0–46.0)
Hemoglobin: 10.6 g/dL — ABNORMAL LOW (ref 12.0–15.0)
MCH: 30.3 pg (ref 26.0–34.0)
MCHC: 33 g/dL (ref 30.0–36.0)
MCV: 91.7 fL (ref 80.0–100.0)
Platelets: 150 10*3/uL (ref 150–400)
RBC: 3.5 MIL/uL — ABNORMAL LOW (ref 3.87–5.11)
RDW: 12.9 % (ref 11.5–15.5)
WBC: 8.7 10*3/uL (ref 4.0–10.5)
nRBC: 0 % (ref 0.0–0.2)

## 2019-05-12 LAB — BASIC METABOLIC PANEL
Anion gap: 10 (ref 5–15)
BUN: 20 mg/dL (ref 8–23)
CO2: 27 mmol/L (ref 22–32)
Calcium: 8.6 mg/dL — ABNORMAL LOW (ref 8.9–10.3)
Chloride: 101 mmol/L (ref 98–111)
Creatinine, Ser: 0.9 mg/dL (ref 0.44–1.00)
GFR calc Af Amer: >60 mL/min (ref >60)
GFR calc non Af Amer: >60 mL/min (ref >60)
Glucose, Bld: 108 mg/dL — ABNORMAL HIGH (ref 70–99)
Potassium: 5 mmol/L (ref 3.5–5.1)
Sodium: 138 mmol/L (ref 135–145)

## 2019-05-12 MED ORDER — POLYETHYLENE GLYCOL 3350 17 G PO PACK
17.0000 g | PACK | Freq: Two times a day (BID) | ORAL | Status: DC | PRN
  Administered 2019-05-12: 17 g via ORAL
  Filled 2019-05-12: qty 1

## 2019-05-12 MED ORDER — METHOCARBAMOL 500 MG PO TABS
500.0000 mg | ORAL_TABLET | Freq: Four times a day (QID) | ORAL | Status: DC | PRN
  Administered 2019-05-14 – 2019-05-16 (×3): 500 mg via ORAL
  Filled 2019-05-12 (×3): qty 1

## 2019-05-12 MED ORDER — ACETAMINOPHEN 500 MG PO TABS
1000.0000 mg | ORAL_TABLET | Freq: Four times a day (QID) | ORAL | Status: DC
  Administered 2019-05-12 – 2019-05-18 (×19): 1000 mg via ORAL
  Filled 2019-05-12 (×22): qty 2

## 2019-05-12 MED ORDER — POLYETHYLENE GLYCOL 3350 17 G PO PACK
17.0000 g | PACK | Freq: Once | ORAL | Status: DC
  Filled 2019-05-12: qty 1

## 2019-05-12 MED ORDER — IOHEXOL 300 MG/ML  SOLN
100.0000 mL | Freq: Once | INTRAMUSCULAR | Status: AC | PRN
  Administered 2019-05-12: 100 mL via INTRAVENOUS

## 2019-05-12 NOTE — Progress Notes (Signed)
[facility]                                                                              Patient Demographics  Yesenia Sullivan, is a 74 y.o. female, DOB - Aug 26, 1945, [id number]  Admit date - 05/10/2019   Admitting Physician Terrilee Croak, MD  Outpatient Primary MD for the patient is Patient, No Pcp Per  Outpatient specialists:   LOS - 2  days   Medical records reviewed and are as summarized below:    Chief Complaint  Patient presents with   Leg Pain   Leg Swelling       Brief summary   Patient is a 74 year old female with no significant past medical history presented to ED on 3/23 with right femur fracture. 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 She noticed a right breast lump 1 year ago. She did not seek any medical opinion and says she was ''trying to praythis away." 4 to 6 weeks ago, patient started feeling fatigue and intermittent pain in her neck, right arm and hip. She started having weakness in her thigh muscles which became progressively worse. On 3/17, patient came to the ED. MRI brain wasdone at that time which showed findings suspicious ofosseous metastatic disease.  According to documentation from that visit, patient showed strong preference to be discharged and follow-up as an outpatient. She was asked to establish a primary care provider. She saw a Teacher, adult education physicians on 3/23.  She was referred to breast clinic for further evaluation. On her way out of the PCP's office, patient suddenly felt a pop on her right thigh followed by severe pain and obvious deformity. EMS was called and patient was sent to the ED. Right femur x-ray showed severely displaced and angulated distal right femoral shaft fracture.   Assessment & Plan   Principal problem Right closed femur fracture ([place]), likely pathological fracture -Per patient, had no fall, orthopedics was  consulted -Underwent ORIF right femoral shaft fracture on 3/24, deep bone biopsy of the right femur was done, postop day #1 -Continue IV Dilaudid for severe pain, oxycodone oral as needed for moderate pain -Start PT today -Pain management, DVT prophylaxis per orthopedics.  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  Now has followed up with PCP and has been referred to breast center -Patient had an MRI of the brain done on 3/17 due to pain in the legs arms, trouble walking which showed 13 mm focus of T2 and diffusion-weighted signal hyperintensity in the right frontoparietal calvarium, also abnormal T1 hypointense marrow signal within portions of visualized upper cervical spine, findings indeterminate and osseous metastatic disease cannot be excluded. -Discussed  with on-call oncology, Dr. Maylon Peppers, recommended CT chest, abdomen and pelvis with contrast while inpatient.  Given bony involvement/possible pathological fracture, recommended SPEP, immunofixation, free light chains, also ordered LFTs -If above work-up is negative, patient will need referral and appointment asap at the breast imaging center for diagnostic mammogram/ultrasound with biopsy.  Once tissue diagnosis obtained, will need oncology, this will need to be managed by outpatient PCP.  Explained all the above in detail to the patient, who verbalized understanding about further management regarding the right breast lump and possible metastases.  Patient reports that she has not had any mammogram in the last 5 or more  years  Constipation -Added stool softeners and laxative  Code Status: Full CODE STATUS DVT Prophylaxis:  Lovenox  Family Communication: Discussed all imaging results, lab results, explained to the patient in detail   Disposition Plan: Patient from home, lives alone will likely need rehab/SNF due to acute right femur fracture.  Starting PT today  Time Spent in minutes   35 minutes  Procedures:  Right pathologic femur fracture s/p ORIF with deep bone biopsy of right femur  Consultants:   Orthopedics  Antimicrobials:   Anti-infectives (From admission, onward)   Start     Dose/Rate Route Frequency Ordered Stop   05/11/19 2000  ceFAZolin (ANCEF) IVPB 2g/100 mL premix     2 g 200 mL/hr over 30 Minutes Intravenous Every 8 hours 05/11/19 1555 05/12/19 1959   05/11/19 1345  vancomycin (VANCOCIN) powder  Status:  Discontinued       As needed 05/11/19 1346 05/11/19 1419   05/11/19 1159  ceFAZolin (ANCEF) 2-4 GM/100ML-% IVPB    Note to Pharmacy: [name]   : cabinet override      05/11/19 1159 05/11/19 2206          Medications  Scheduled Meds:  enoxaparin (LOVENOX) injection  40 mg Subcutaneous Q24H   polyethylene glycol  17 g Oral Once   senna  1 tablet Oral BID   sodium chloride flush  3 mL Intravenous Q12H   Continuous Infusions:   ceFAZolin (ANCEF) IV 2 g (05/12/19 0418)   PRN Meds:.hydrALAZINE, HYDROmorphone (DILAUDID) injection, magnesium hydroxide, ondansetron **OR** ondansetron (ZOFRAN) IV, oxyCODONE, polyethylene glycol, sodium phosphate, sorbitol      Subjective:   Yesenia Sullivan was seen and examined today.  States pain in the right leg 9/10, last BM on Tuesday.  Patient denies dizziness, chest pain, shortness of breath, abdominal pain. + Constipation  Objective:   Vitals:   05/11/19 1938 05/12/19 0008 05/12/19 0300 05/12/19 0830  BP: (!) 148/66 122/65 107/66 127/66  Pulse: 82  65 84  Resp: 17 17 18 18   Temp: 98 F (36.7 C) 97.9 F (36.6  C) 98.2 F (36.8 C) 98.1 F (36.7 C)  TempSrc: Oral Oral Oral Oral  SpO2: 96% 94% 97% 96%  Weight:      Height:        Intake/Output Summary (Last 24 hours) at 05/12/2019 [id number] Last data filed at 05/12/2019 0500 Gross per 24 hour  Intake 1983 ml  Output 1115 ml  Net 868 ml     Wt Readings from Last 3 Encounters:  05/11/19 73.9 kg  05/04/19 73.9 kg     Exam  General: Alert and oriented x 3, NAD, uncomfortable with pain  Cardiovascular: S1 S2 auscultated, no murmurs, RRR  Respiratory: Clear to auscultation bilaterally, no wheezing  Gastrointestinal: Soft, nontender, nondistended, + bowel sounds  Ext: Right lower extremity dressing intact  Neuro: No new deficits, ankle dorsiflexion/plantarflexion intact  Musculoskeletal: No digital cyanosis, clubbing  Skin: No rashes  Psych: Normal affect and demeanor, alert and oriented x3    Data Reviewed:  I have personally reviewed following labs and imaging studies  Micro Results Recent Results (from the past 240 hour(s))  SARS CORONAVIRUS 2 (TAT 6-24 HRS) Nasopharyngeal Nasopharyngeal Swab     Status: None   Collection Time: 05/04/19  3:53 PM   Specimen: Nasopharyngeal Swab  Result Value Ref Range Status   SARS Coronavirus 2 NEGATIVE NEGATIVE Final    Comment: (NOTE) SARS-CoV-2 target nucleic acids are NOT DETECTED. The SARS-CoV-2 RNA is generally detectable in upper and lower respiratory specimens during the acute phase of infection. Negative results do not preclude SARS-CoV-2 infection, do not rule out co-infections with other pathogens, and should not be used as the sole basis for treatment or other patient management decisions. Negative results must be combined with clinical observations, patient history, and epidemiological information. The expected result is Negative. Fact Sheet for Patients: SugarRoll.be Fact Sheet for Healthcare  Providers: https://www.woods-mathews.com/ This test is not yet approved or cleared by the [place] FDA and  has been authorized for detection and/or diagnosis of SARS-CoV-2 by FDA under an Emergency Use Authorization (EUA). This EUA will remain  in effect (meaning this test can be used) for the duration of the COVID-19 declaration under Section 56 4(b)(1) of the Act, 21 U.S.C. section 360bbb-3(b)(1), unless the authorization is terminated or revoked sooner. Performed at [facility], [place] [address], [place], [place] [address]   Respiratory Panel by RT PCR (Flu A&B, Covid) - Nasopharyngeal Swab     Status: None   Collection Time: 05/10/19  4:46 PM   Specimen: Nasopharyngeal Swab  Result Value Ref Range Status   SARS Coronavirus 2 by RT PCR NEGATIVE NEGATIVE Final    Comment: (NOTE) SARS-CoV-2 target nucleic acids are NOT DETECTED. The SARS-CoV-2 RNA is generally detectable in upper respiratoy specimens during the acute phase of infection. The lowest concentration of SARS-CoV-2 viral copies this assay can detect is 131 copies/mL. A negative result does not preclude SARS-Cov-2 infection and should not be used as the sole basis for treatment or other patient management decisions. A negative result may occur with  improper specimen collection/handling, submission of specimen other than nasopharyngeal swab, presence of viral mutation(s) within the areas targeted by this assay, and inadequate number of viral copies (<131 copies/mL). A negative result must be combined with clinical observations, patient history, and epidemiological information. The expected result is Negative. Fact Sheet for Patients:  PinkCheek.be Fact Sheet for Healthcare Providers:  GravelBags.it This test is not yet ap proved or cleared by the [place] FDA and  has been authorized for detection and/or diagnosis of SARS-CoV-2 by FDA  under an Emergency Use Authorization (EUA). This EUA will remain  in effect (meaning this test can be used) for the duration of the COVID-19 declaration under Section 564(b)(1) of the Act, 21 U.S.C. section 360bbb-3(b)(1), unless the authorization is terminated or revoked sooner.    Influenza A by PCR NEGATIVE NEGATIVE Final   Influenza B by PCR NEGATIVE NEGATIVE Final    Comment: (NOTE) The Xpert Xpress SARS-CoV-2/FLU/RSV assay is intended as an aid in  the diagnosis of influenza from Nasopharyngeal swab specimens and  should not be used as a sole basis for treatment. Nasal washings and  aspirates are unacceptable for Xpert Xpress SARS-CoV-2/FLU/RSV  testing. Fact Sheet for Patients: PinkCheek.be Fact  Sheet for Healthcare Providers: GravelBags.it This test is not yet approved or cleared by the [place] and  has been authorized for detection and/or diagnosis of SARS-CoV-2 by  FDA under an Emergency Use Authorization (EUA). This EUA will remain  in effect (meaning this test can be used) for the duration of the  Covid-19 declaration under Section 564(b)(1) of the Act, 21  U.S.C. section 360bbb-3(b)(1), unless the authorization is  terminated or revoked. Performed at [facility], [place] [address], [place], [place] [address]     Radiology Reports DG Chest 1 View  Result Date: 05/10/2019 CLINICAL DATA:  Preop for femur surgery. EXAM: CHEST  1 VIEW COMPARISON:  None. FINDINGS: The heart size and mediastinal contours are within normal limits. Both lungs are clear. The visualized skeletal structures are unremarkable. IMPRESSION: No active disease. Electronically Signed   By: Marijo Conception M.D.   On: 05/10/2019 15:26   MR BRAIN WO CONTRAST  Result Date: 05/04/2019 CLINICAL DATA:  Metastatic disease evaluation. Additional history provided by technologist: Patient reports pain in legs and arms over 2  weeks, trouble walking, breast lump, trouble walking due to leg pain. EXAM: MRI HEAD WITHOUT CONTRAST TECHNIQUE: Multiplanar, multiecho pulse sequences of the brain and surrounding structures were obtained without intravenous contrast. COMPARISON:  No pertinent prior studies available for comparison. FINDINGS: Brain: Please note evaluation for intracranial metastatic disease is limited on this non-contrast examination. There is mild motion degradation of the axial diffusion-weighted imaging. No evidence of acute infarct. No evidence of intracranial mass. No midline shift or extra-axial fluid collection. No chronic intracranial blood products. Mild scattered T2/FLAIR hyperintensity within the cerebral white matter is nonspecific, but consistent with chronic small vessel ischemic disease. Mild generalized parenchymal atrophy. Vascular: Flow voids maintained within the proximal large arterial vessels. Skull and upper cervical spine: There is a 13 mm focus of T2 hyperintensity and corresponding diffusion weighted hyperintensity within the right frontoparietal calvarium (series 11, image 31) (series 7, image 83). There is abnormal T1 hypointense signal within portions of the upper cervical spine with relative sparing of the C4 vertebral body. Sinuses/Orbits: Visualized orbits demonstrate no acute abnormality. Mild ethmoid sinus mucosal thickening. No significant mastoid effusion. IMPRESSION: 13 mm focus of T2 and diffusion-weighted signal hyperintensity within the right frontoparietal calvarium. There is also abnormal T1 hypointense marrow signal within portions of the visualized upper cervical spine with relative sparing of the C3 vertebra. Findings are indeterminate and osseous metastatic disease cannot be excluded. Consider nonemergent postcontrast MR imaging of the head and contrast-enhanced MRI of the cervical spine for further evaluation. Within the limitations of a noncontrast study, there is no evidence of  metastatic disease within the intracranial compartment. No evidence of acute intracranial abnormality. Mild generalized parenchymal atrophy and chronic small vessel ischemic disease. Electronically Signed   By: Kellie Simmering DO   On: 05/04/2019 14:58   DG Knee Right Port  Result Date: 05/11/2019 CLINICAL DATA:  Fracture, postop. EXAM: PORTABLE RIGHT KNEE - 1-2 VIEW COMPARISON:  Preoperative radiograph 05/10/2019. Concurrent femur radiograph, reported separately. FINDINGS: Lateral plate and multi screw fixation of displaced distal femoral shaft fracture. Fracture is in improved alignment compared to preoperative imaging. Proximal aspect of the hardware is included on concurrent femur exam. Recent postsurgical change includes air and edema in the soft tissues. IMPRESSION: Lateral plate and screw fixation of displaced distal femoral shaft fracture, improved alignment compared to preoperative imaging. No medial postoperative complication. Electronically Signed   By: Threasa Beards  Sanford M.D.   On: 05/11/2019 15:49   DG Humerus Left  Result Date: 05/11/2019 CLINICAL DATA:  Fracture. EXAM: LEFT HUMERUS - 2+ VIEW COMPARISON:  None. FINDINGS: Cortical margins of the humerus are intact. There is no evidence of fracture or other focal bone lesions. Shoulder and elbow alignment grossly maintained. Soft tissues are unremarkable. Blood pressure cuff overlies the arm and IV in the antecubital fossa. IMPRESSION: Negative radiographs of the left humerus. Electronically Signed   By: Keith Rake M.D.   On: 05/11/2019 15:50   DG Humerus Right  Result Date: 05/11/2019 CLINICAL DATA:  Fracture. EXAM: RIGHT HUMERUS - 2+ VIEW COMPARISON:  None. FINDINGS: Cortical margins of the humerus are intact. There is no evidence of fracture or other focal bone lesions. Shoulder and elbow alignment are grossly maintained. Soft tissues are unremarkable. IMPRESSION: Negative radiographs of the right humerus. Electronically Signed   By:  Keith Rake M.D.   On: 05/11/2019 15:52   DG C-Arm 1-60 Min  Result Date: 05/11/2019 CLINICAL DATA:  ORIF of right femur. FLUOROSCOPY TIME:  1 minutes 58 seconds. Images: 10 EXAM: RIGHT FEMUR 2 VIEWS COMPARISON:  None. FINDINGS: A displaced distal femoral diaphysis fracture seen at the beginning of the study. Throughout the study, the fracture was reduced and a sideplate was affixed to the distal femur, crossing the fracture. The patient is status post right hip replacement as well. Only the distal femoral stem is visualized of the right hip replacement. IMPRESSION: Right distal femoral fracture repair as above. Electronically Signed   By: Dorise Bullion III M.D   On: 05/11/2019 16:01   DG HIP UNILAT WITH PELVIS 2-3 VIEWS RIGHT  Result Date: 05/10/2019 CLINICAL DATA:  Right leg injury. EXAM: DG HIP (WITH OR WITHOUT PELVIS) 2-3V RIGHT COMPARISON:  None. FINDINGS: Status post right total hip arthroplasty. The right acetabular and femoral components appear to be well situated. No fracture or dislocation is seen involving the visualized skeleton. IMPRESSION: No acute abnormality seen in the right hip. Status post right total hip arthroplasty. Electronically Signed   By: Marijo Conception M.D.   On: 05/10/2019 15:26   DG FEMUR, MIN 2 VIEWS RIGHT  Result Date: 05/11/2019 CLINICAL DATA:  ORIF of right femur. FLUOROSCOPY TIME:  1 minutes 58 seconds. Images: 10 EXAM: RIGHT FEMUR 2 VIEWS COMPARISON:  None. FINDINGS: A displaced distal femoral diaphysis fracture seen at the beginning of the study. Throughout the study, the fracture was reduced and a sideplate was affixed to the distal femur, crossing the fracture. The patient is status post right hip replacement as well. Only the distal femoral stem is visualized of the right hip replacement. IMPRESSION: Right distal femoral fracture repair as above. Electronically Signed   By: Dorise Bullion III M.D   On: 05/11/2019 15:59   DG FEMUR, MIN 2 VIEWS  RIGHT  Result Date: 05/11/2019 CLINICAL DATA:  Right femur fracture, postop. EXAM: RIGHT FEMUR 2 VIEWS COMPARISON:  Prior operative radiograph yesterday. FINDINGS: Lateral plate and multi screw fixation of distal femoral shaft fracture. Fracture is in improved alignment compared to preoperative imaging. Right hip arthroplasty appears intact. Recent postsurgical change includes air and edema in the soft tissues. IMPRESSION: Post ORIF distal right femur fracture in improved alignment compared to preoperative imaging. No immediate postoperative complications. Electronically Signed   By: Keith Rake M.D.   On: 05/11/2019 15:54   DG FEMUR, MIN 2 VIEWS RIGHT  Result Date: 05/10/2019 CLINICAL DATA:  Right leg injury. EXAM:  RIGHT FEMUR 2 VIEWS COMPARISON:  None. FINDINGS: Severely displaced and angulated fracture is seen involving the distal right femoral shaft. Status post right total hip arthroplasty. No soft tissue abnormality is noted. IMPRESSION: Severely displaced and angulated distal right femoral shaft fracture. Electronically Signed   By: Marijo Conception M.D.   On: 05/10/2019 15:24   DG FEMUR PORT MIN 2 VIEWS LEFT  Result Date: 05/11/2019 CLINICAL DATA:  Fracture. Recent right femur fracture post surgical fixation. EXAM: LEFT FEMUR PORTABLE 2 VIEWS COMPARISON:  None. FINDINGS: No evidence of left femur fracture. There is questionable cortical thinning involving the proximal femur in the subtrochanteric region laterally with decreased bone density. Hip and knee alignment are grossly maintained. No soft tissue abnormality. IMPRESSION: 1. No evidence of left femur fracture. 2. Questionable cortical thinning involving the proximal femur in the subtrochanteric region laterally. Findings are suspicious for underlying bone lesion, recommend further evaluation with MRI when patient is able. Electronically Signed   By: Keith Rake M.D.   On: 05/11/2019 16:31    Lab Data:  CBC: Recent Labs  Lab  05/10/19 1530 05/11/19 0333 05/12/19 0433  WBC 11.2* 8.8 8.7  NEUTROABS 10.1*  --   --   HGB 12.6 12.1 10.6*  HCT 38.2 37.2 32.1*  MCV 90.7 92.5 91.7  PLT 180 159 [id number]   Basic Metabolic Panel: Recent Labs  Lab 05/10/19 1530 05/11/19 0333 05/12/19 0433  NA 139 139 138  K 3.9 4.0 5.0  CL 102 104 101  CO2 25 26 27   GLUCOSE 130* 91 108*  BUN 27* 31* 20  CREATININE 0.74 0.96 0.90  CALCIUM 9.1 9.0 8.6*   GFR: Estimated Creatinine Clearance: 52.5 mL/min (by C-G formula based on SCr of 0.9 mg/dL). Liver Function Tests: Recent Labs  Lab 05/10/19 1530  AST 51*  ALT 20  ALKPHOS 168*  BILITOT 0.6  PROT 7.6  ALBUMIN 4.4   No results for input(s): LIPASE, AMYLASE in the last 168 hours. No results for input(s): AMMONIA in the last 168 hours. Coagulation Profile: No results for input(s): INR, PROTIME in the last 168 hours. Cardiac Enzymes: No results for input(s): CKTOTAL, CKMB, CKMBINDEX, TROPONINI in the last 168 hours. BNP (last 3 results) No results for input(s): PROBNP in the last 8760 hours. HbA1C: No results for input(s): HGBA1C in the last 72 hours. CBG: No results for input(s): GLUCAP in the last 168 hours. Lipid Profile: No results for input(s): CHOL, HDL, LDLCALC, TRIG, CHOLHDL, LDLDIRECT in the last 72 hours. Thyroid Function Tests: No results for input(s): TSH, T4TOTAL, FREET4, T3FREE, THYROIDAB in the last 72 hours. Anemia Panel: No results for input(s): VITAMINB12, FOLATE, FERRITIN, TIBC, IRON, RETICCTPCT in the last 72 hours. Urine analysis: No results found for: COLORURINE, APPEARANCEUR, LABSPEC, PHURINE, GLUCOSEU, HGBUR, BILIRUBINUR, KETONESUR, PROTEINUR, UROBILINOGEN, NITRITE, LEUKOCYTESUR   Lonnie Rosado M.D. [facility] 05/12/2019, 8:37 AM   Call night coverage person covering after 7pm

## 2019-05-12 NOTE — Evaluation (Signed)
Occupational Therapy Evaluation Patient Details Name: Yesenia Sullivan MRN: [id number] DOB: Apr 14, 1945 Today's Date: 05/12/2019    History of Present Illness 74 yo admitted as she felt her leg pop leaving doctors office with pathologic right femur fx s/p ORIF. 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 Pt noted breast lump a year ago but did not seek medical tx. PMHx: Rt THA   Clinical Impression   Pt with decline in function and safety with ADLs and ADL mobility with impaired balance and endurance. Pt lives at home alone and was independent with ADLs/selfcare, IADLs, home mgt, mobility and was driving. Pt states that he friend will be able to stay wit her to assist as needed. Pt currently requires set p with UB selfcare/grooming, mod A with LB selfcare, min A with toileting and min - min guard A with functional mobility using RW. Pt would benefit from acute OT services to address impairments to maximize level of function and safety    Follow Up Recommendations  Home health OT    Equipment Recommendations  3 in 1 bedside commode;Wheelchair (measurements OT);Wheelchair cushion (measurements OT)(LH bath sponge)    Recommendations for Other Services       Precautions / Restrictions Precautions Precautions: Fall Restrictions Weight Bearing Restrictions: Yes RLE Weight Bearing: Touchdown weight bearing      Mobility Bed Mobility Overal bed mobility: Modified Independent             General bed mobility comments: pt in recliner upon arrival  Transfers Overall transfer level: Needs assistance   Transfers: Sit to/from Stand Sit to Stand: Min guard         General transfer comment: cues for hand placement, safety and increased time to transition hand from bed to RW    Balance Overall balance assessment: Needs assistance Sitting-balance support: Feet unsupported;No upper extremity supported Sitting balance-Leahy Scale: Good     Standing balance  support: Bilateral upper extremity supported;During functional activity Standing balance-Leahy Scale: Fair Standing balance comment: pt able to stand on LLE with single UE support                           ADL either performed or assessed with clinical judgement   ADL Overall ADL's : Needs assistance/impaired Eating/Feeding: Independent;Sitting   Grooming: Wash/dry hands;Wash/dry face;Set up;Sitting   Upper Body Bathing: Set up;Sitting   Lower Body Bathing: Moderate assistance   Upper Body Dressing : Set up;Sitting   Lower Body Dressing: Moderate assistance   Toilet Transfer: Minimal assistance;Min guard;RW;Stand-pivot   Toileting- Clothing Manipulation and Hygiene: Minimal assistance       Functional mobility during ADLs: Minimal assistance;Min guard;Cueing for safety;Rolling walker General ADL Comments: pt has a reacher and shower seat at home     Vision Patient Visual Report: No change from baseline       Perception     Praxis      Pertinent Vitals/Pain Pain Assessment: 0-10 Pain Score: 6  Pain Location: right thigh Pain Descriptors / Indicators: Aching;Burning Pain Intervention(s): Monitored during session;Premedicated before session;Repositioned     Hand Dominance Right   Extremity/Trunk Assessment Upper Extremity Assessment Upper Extremity Assessment: Overall WFL for tasks assessed   Lower Extremity Assessment Lower Extremity Assessment: Defer to PT evaluation   Cervical / Trunk Assessment Cervical / Trunk Assessment: Normal   Communication Communication Communication: No difficulties   Cognition Arousal/Alertness: Awake/alert Behavior During Therapy: WFL for tasks assessed/performed Overall Cognitive Status:  Within Functional Limits for tasks assessed                                     General Comments       Exercises General Exercises - Lower Extremity Long Arc Quad: AROM;Right;Seated;10 reps Hip  Flexion/Marching: AROM;Right;Seated;10 reps   Shoulder Instructions      Home Living Family/patient expects to be discharged to:: Private residence Living Arrangements: Alone Available Help at Discharge: Friend(s);Available PRN/intermittently Type of Home: House Home Access: Stairs to enter [employer] of Steps: 2   Home Layout: One level     Bathroom Shower/Tub: Occupational psychologist: Standard     Home Equipment: Environmental consultant - 2 wheels          Prior Functioning/Environment Level of Independence: Independent with assistive device(s)        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        OT Problem List: Impaired balance (sitting and/or standing);Pain;Decreased activity tolerance;Decreased knowledge of use of DME or AE      OT Treatment/Interventions: Self-care/ADL training;DME and/or AE instruction;Therapeutic activities;Balance training;Patient/family education;Therapeutic exercise    OT Goals(Current goals can be found in the care plan section) Acute Rehab OT Goals Patient Stated Goal: return home OT Goal Formulation: With patient Time For Goal Achievement: 05/26/19 Potential to Achieve Goals: Good ADL Goals Pt Will Perform Lower Body Bathing: with min assist;with min guard assist;with adaptive equipment;with caregiver independent in assisting Pt Will Perform Lower Body Dressing: with min assist;with min guard assist;with adaptive equipment;with caregiver independent in assisting Pt Will Transfer to Toilet: with min guard assist;stand pivot transfer Pt Will Perform Toileting - Clothing Manipulation and hygiene: with min guard assist;sit to/from stand Pt Will Perform Tub/Shower Transfer: with min guard assist;rolling walker;Stand pivot transfer  OT Frequency: Min 2X/week   Barriers to D/C:            Co-evaluation              AM-PAC OT "6 Clicks" Daily Activity     Outcome Measure Help from  another person eating meals?: None Help from another person taking care of personal grooming?: A Little Help from another person toileting, which includes using toliet, bedpan, or urinal?: A Little Help from another person bathing (including washing, rinsing, drying)?: A Lot Help from another person to put on and taking off regular upper body clothing?: A Little Help from another person to put on and taking off regular lower body clothing?: A Lot 6 Click Score: 17   End of Session Equipment Utilized During Treatment: Gait belt;Rolling walker;Other (comment)(BSC)  Activity Tolerance: Patient tolerated treatment well Patient left: in chair;with call bell/phone within reach;with chair alarm set  OT Visit Diagnosis: Unsteadiness on feet (R26.81);Other abnormalities of gait and mobility (R26.89);Muscle weakness (generalized) (M62.81);Pain Pain - Right/Left: Left Pain - part of body: Leg                Time: [id number] OT Time Calculation (min): 24 min Charges:  OT General Charges $OT Visit: 1 Visit OT Evaluation $OT Eval Moderate Complexity: 1 Mod OT Treatments $Self Care/Home Management : 8-22 mins    Emmit Alexanders [facility] 05/12/2019, 11:04 AM

## 2019-05-12 NOTE — Anesthesia Postprocedure Evaluation (Signed)
Anesthesia Post Note  Patient: Yesenia Sullivan  Procedure(s) Performed: OPEN REDUCTION INTERNAL FIXATION (ORIF) DISTAL FEMUR FRACTURE (Right Leg Upper)     Patient location during evaluation: PACU Anesthesia Type: General Level of consciousness: awake and alert and oriented Pain management: pain level controlled Vital Signs Assessment: post-procedure vital signs reviewed and stable Respiratory status: spontaneous breathing, nonlabored ventilation and respiratory function stable Cardiovascular status: blood pressure returned to baseline Postop Assessment: no apparent nausea or vomiting Anesthetic complications: no                 Brennan Bailey

## 2019-05-12 NOTE — Progress Notes (Signed)
Orthopaedic Trauma Progress Note  S: Doing okay this morning.  Rates pain in right leg 5/10.  Was able to get up to bedside commode yesterday afternoon.  Feels that her left leg is weak, is a bit hesitant about putting weight through the left leg to ambulate.  Describes that this weakness is a similar feeling to what her right leg felt like before sustaining her fracture.  About to receive pain medication and work with physical therapy.  Deep bone biopsy taken from right femur intraoperatively has been sent for pathology, awaiting results.  Patient does note that she has had alternating, intermittent pain in bilateral upper extremities recently.  States she would not be surprised if bone lesions are present in these areas.  O:  Vitals:   05/12/19 0300 05/12/19 0830  BP: 107/66 127/66  Pulse: 65 84  Resp: 18 18  Temp: 98.2 F (36.8 C) 98.1 F (36.7 C)  SpO2: 97% 96%    General: Laying in bed, no acute distress Respiratory: No increased work of breathing. Lungs CTA anterior lung fields bilaterally Right lower extremity: Dressing is clean, dry, intact.  No significant swelling through the thigh or lower leg.  No knee effusion.  Some tenderness with palpation over the distal thigh. Tolerates some knee motion.  Ankle dorsiflexion/plantarflexion is intact.  Compartments are soft and compressible.  2+ DP pulse   Imaging: Stable post op imaging right femur.  AP and lateral views of the left femur showed some questionable cortical thinning laterally in the subtrochanteric region with decreased bone density, suspicious for underlying bone lesion.   Labs:  Results for orders placed or performed during the hospital encounter of 05/10/19 (from the past 24 hour(s))  VITAMIN D 25 Hydroxy (Vit-D Deficiency, Fractures)     Status: None   Collection Time: 05/11/19  4:45 PM  Result Value Ref Range   Vit D, 25-Hydroxy 44.24 30 - 100 ng/mL  Basic metabolic panel     Status: Abnormal   Collection Time:  05/12/19  4:33 AM  Result Value Ref Range   Sodium 138 135 - 145 mmol/L   Potassium 5.0 3.5 - 5.1 mmol/L   Chloride 101 98 - 111 mmol/L   CO2 27 22 - 32 mmol/L   Glucose, Bld 108 (H) 70 - 99 mg/dL   BUN 20 8 - 23 mg/dL   Creatinine, Ser 0.90 0.44 - 1.00 mg/dL   Calcium 8.6 (L) 8.9 - 10.3 mg/dL   GFR calc non Af Amer >60 >60 mL/min   GFR calc Af Amer >60 >60 mL/min   Anion gap 10 5 - 15  CBC     Status: Abnormal   Collection Time: 05/12/19  4:33 AM  Result Value Ref Range   WBC 8.7 4.0 - 10.5 K/uL   RBC 3.50 (L) 3.87 - 5.11 MIL/uL   Hemoglobin 10.6 (L) 12.0 - 15.0 g/dL   HCT 32.1 (L) 36.0 - 46.0 %   MCV 91.7 80.0 - 100.0 fL   MCH 30.3 26.0 - 34.0 pg   MCHC 33.0 30.0 - 36.0 g/dL   RDW 12.9 11.5 - 15.5 %   Platelets 150 150 - 400 K/uL   nRBC 0.0 0.0 - 0.2 %    Assessment: 74 year old female status post low energy right leg injury, 1 Day Post-Op   Injuries: Right pathologic femur fracture s/p ORIF with deep bone biopsy of right femur  Weightbearing: TDWB RLE, WBAT LLE  Insicional and dressing care: Plan to remove dressing  and leave open to air tomorrow  Showering: Okay to begin showering 05/14/2019  Orthopedic device(s): None   CV/Blood loss: Acute blood loss anemia, Hgb 10.6 this morning. Hemodynamically stable  Pain management:  1. Tylenol 1000 mg q 6 hours scheduled 2. Robaxin 500 mg q 6 hours PRN 3. Oxycodone 5-10 mg q 4 hours PRN 4. Dilaudid 1 mg q 3 hours PRN  VTE prophylaxis: Lovenox starting today SCDs: Ordered, currently in place on left lower extremity.  Okay to apply to bilateral lower extremities.  ID:  Ancef 2gm post op  Foley/Lines: No foley, KVO IVFs  Medical co-morbidities: No significant past medical history.  Did noticed a right breast lump 1 year ago, did not seek any medical treatment at that time.  Impediments to Fracture Healing: Pathologic fracture.  Vitamin D level is 45, continue with daily multivitamin.  Dispo: PT/OT evaluation, dispo  pending.  Patient will need a work-up in regards to her possible cancer.  Awaiting pathology results from right femur deep bone biopsy taken intraoperatively.  Will need breast biopsy done and subsequent oncology evaluation as an outpatient.  Patient okay for discharge from orthopedic standpoint once cleared by therapy and medical team.  Follow - up plan: 2 weeks for repeat x-rays of right femur and wound check  Contact information:  Katha Hamming MD, Patrecia Pace PA-C   Tej Murdaugh A. [facility] [phone] (office) [email or web address]

## 2019-05-12 NOTE — TOC Initial Note (Addendum)
Transition of Care [facility]) - Initial/Assessment Note    Patient Details  Name: Yesenia Sullivan MRN: [id number] Date of Birth: 04-21-1945  Transition [facility]) CM/SW Contact:    Sharin Mons, RN Phone Number: [phone] 05/12/2019, 11:55 AM  Clinical Narrative:     Admitted with pathologic R femur fx. ? breast CA with mets. From home alone. PTA independent with ADL'S , no DME usage. Owns rolling waker. Requesting a wheelchair and 3 in 1/BSC @ d/c.  NCM spoke with pt @ bedside regard to d/c planning. Shared PT's eval. / recommendations:Home health PT;Supervision - Intermittent. Pt states she's agreeable to [facility] services, no SNF placement. Choice list provided. Pt without preference. Referral made with [facility]. acceptance pending.  Pt states once d/c she will have 24/7 assistance and supervision from friend. States has no family in [place].  Orders will be needed for HHPT, W/C, 3 in 1/BSC prior to d/c from MD.   [facility] team will continue to monitor and follow for needs....   05/12/2019 @ 1301 NCM received test message from [facility] will not be able to provide [facility] services for pt. Referral made with [facility] per NCM and was declined, limited service. Referral made with [facility], acceptance pending...   05/13/2019 Home health services declined by [facility]. TOC team will continue to search for [facility] agency.   - Pt s/p nailing of left femur, 3/26  Expected Discharge Plan: [facility](Declined SNF placement) Barriers to Discharge: Continued Medical Work up   Patient Goals and CMS Choice Patient states their goals for this hospitalization and ongoing recovery are:: go home to get better CMS Medicare.gov Compare Post Acute Care list provided to:: Patient Choice offered to / list presented to : Patient  Expected Discharge Plan and Services Expected Discharge Plan: [facility](Declined SNF placement)   Discharge Planning Services: CM Consult   Living arrangements  for the past 2 months: Single Family Home                     Prior Living Arrangements/Services Living arrangements for the past 2 months: Single Family Home Lives with:: Self Patient language and need for interpreter reviewed:: Yes Do you feel safe going back to the place where you live?: Yes      Need for Family Participation in Patient Care: Yes (Comment) Care giver support system in place?: Yes (comment)   Criminal Activity/Legal Involvement Pertinent to Current Situation/Hospitalization: No - Comment as needed  Activities of Daily Living Home Assistive Devices/Equipment: Walker (specify type) ADL Screening (condition at time of admission) Patient's cognitive ability adequate to safely complete daily activities?: Yes Is the patient deaf or have difficulty hearing?: No Does the patient have difficulty seeing, even when wearing glasses/contacts?: No Does the patient have difficulty concentrating, remembering, or making decisions?: Yes Patient able to express need for assistance with ADLs?: Yes Does the patient have difficulty dressing or bathing?: No Independently performs ADLs?: No Communication: Independent Dressing (OT): Independent Grooming: Independent Feeding: Independent Bathing: Independent Toileting: Needs assistance Is this a change from baseline?: Change from baseline, expected to last >3days In/Out Bed: Needs assistance Is this a change from baseline?: Change from baseline, expected to last >3 days Walks in Home: Dependent Is this a change from baseline?: Change from baseline, expected to last >3 days Does the patient have difficulty walking or climbing stairs?: No Weakness of Legs: Right Weakness of Arms/Hands: Both  Permission Sought/Granted Permission sought to  share information with : Case Manager                Emotional Assessment Appearance:: Appears stated age Attitude/Demeanor/Rapport: Gracious Affect (typically observed):  Accepting Orientation: : Oriented to Self, Oriented to Place, Oriented to  Time, Oriented to Situation Alcohol / Substance Use: Not Applicable Psych Involvement: No (comment)  Admission diagnosis:  Fall [W19.XXXA] Closed femur fracture ([place]) [S72.90XA] Closed fracture of shaft of right femur, unspecified fracture morphology, initial encounter ([place]) [S72.301A] Patient Active Problem List   Diagnosis Date Noted  . Closed femur fracture ([place]) 05/10/2019   PCP:  Patient, No Pcp Per Pharmacy:   [id number] [address], [place] [address] Phone: [phone] Fax: [phone]     Social Determinants of Health (SDOH) Interventions    Readmission Risk Interventions No flowsheet data found.

## 2019-05-12 NOTE — Progress Notes (Signed)
Ortho Trauma Note  Ct scan of left femur reviewed and discussed with patient. She has lytic lesion in peritrochanteric region with >1/3 of femur involved with weightbearing pain. Due to high risk of pathologic fx, I recommend prophylactic nailing of the left femur. This will prevent fracture and aid in recovery from right femur fx. Patient agrees and will plan for surgery tomorrow AM. NPO past midnight.  Katha Hamming, MD< [phone]

## 2019-05-12 NOTE — Progress Notes (Cosign Needed Addendum)
    Durable Medical Equipment  (From admission, onward)         Start     Ordered   05/12/19 1228  For home use only DME standard manual wheelchair with seat cushion  Once    Comments: Patient suffers from weakness which impairs their ability to perform daily activities like walking in the home.  A rolling walker  will not resolve issue with performing activities of daily living. A wheelchair will allow patient to safely perform daily activities. Patient can safely propel the wheelchair in the home or has a caregiver who can provide assistance. Length of need 12 months Accessories: elevating leg rests (ELRs), wheel locks, extensions and anti-tippers.   05/12/19 1228   05/12/19 1227  For home use only DME 3 n 1  Once     05/12/19 1228

## 2019-05-12 NOTE — Plan of Care (Signed)

## 2019-05-12 NOTE — Evaluation (Signed)
Physical Therapy Evaluation Patient Details Name: Yesenia Sullivan MRN: [id number] DOB: 07-25-1945 Today's Date: 05/12/2019   History of Present Illness  74 yo admitted as she felt her leg pop leaving doctors office with pathologic right femur fx s/p ORIF. 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 Pt noted breast lump a year ago but did not seek medical tx. PMHx: Rt THA  Clinical Impression  Pt pleasant and seems a bit overwhelmed with all new information and situation leading to admission. Pt fearful of LLE "popping" with gait as RLE did and denied increased gait distance with discussion that she would feel more comfortable/safer with WC mobility without assist at home and given probably pathologic fx I agree with this. Pt with decreased activity tolerance, function and mobility who will benefit from acute therapy to maximize mobility, safety and independence. Pt encouraged to have someone install hand held shower head and to talk to friends for confirmed assist for D/C. Pt will plan to bump up stairs in [facility] with assist of neighbor.    Follow Up Recommendations Home health PT;Supervision - Intermittent    Equipment Recommendations  3in1 (PT);Wheelchair (measurements PT);Wheelchair cushion (measurements PT)    Recommendations for Other Services OT consult     Precautions / Restrictions Precautions Precautions: Fall Restrictions RLE Weight Bearing: Touchdown weight bearing      Mobility  Bed Mobility Overal bed mobility: Modified Independent                Transfers Overall transfer level: Needs assistance   Transfers: Sit to/from Stand Sit to Stand: Min guard         General transfer comment: cues for hand placement, safety and increased time to transition hand from bed to RW  Ambulation/Gait Ambulation/Gait assistance: Min guard Gait Distance (Feet): 5 Feet Assistive device: Rolling walker (2 wheeled) Gait Pattern/deviations: Step-to pattern    Gait velocity interpretation: <1.8 ft/sec, indicate of risk for recurrent falls General Gait Details: cues for sequence and posture, pt declined increased gait due to fear of stability of LLE  Stairs            Wheelchair Mobility    Modified Rankin (Stroke Patients Only)       Balance Overall balance assessment: Needs assistance   Sitting balance-Leahy Scale: Good       Standing balance-Leahy Scale: Fair Standing balance comment: pt able to stand on LLE with single UE support                             Pertinent Vitals/Pain Pain Assessment: 0-10 Pain Score: 5  Pain Location: right thigh Pain Descriptors / Indicators: Aching;Burning Pain Intervention(s): Limited activity within patient's tolerance;Monitored during session;Repositioned;RN gave pain meds during session    [place] expects to be discharged to:: Private residence Living Arrangements: Alone Available Help at Discharge: Friend(s);Available PRN/intermittently Type of Home: House Home Access: Stairs to enter   [employer] of Steps: 2 Home Layout: One level Home Equipment: Walker - 2 wheels      Prior Function Level of Independence: Independent with assistive device(s)         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     Hand Dominance        Extremity/Trunk Assessment   Upper Extremity Assessment Upper Extremity Assessment: Overall WFL for tasks assessed    Lower Extremity Assessment Lower  Extremity Assessment: Overall WFL for tasks assessed    Cervical / Trunk Assessment Cervical / Trunk Assessment: Normal  Communication   Communication: No difficulties  Cognition Arousal/Alertness: Awake/alert Behavior During Therapy: WFL for tasks assessed/performed Overall Cognitive Status: Within Functional Limits for tasks assessed                                        General Comments       Exercises General Exercises - Lower Extremity Long Arc Quad: AROM;Right;Seated;10 reps Hip Flexion/Marching: AROM;Right;Seated;10 reps   Assessment/Plan    PT Assessment Patient needs continued PT services  PT Problem List Decreased mobility;Decreased activity tolerance;Decreased knowledge of use of DME;Pain       PT Treatment Interventions DME instruction;Therapeutic exercise;Gait training;Functional mobility training;Stair training;Therapeutic activities;Patient/family education    PT Goals (Current goals can be found in the Care Plan section)  Acute Rehab PT Goals Patient Stated Goal: return home PT Goal Formulation: With patient Time For Goal Achievement: 05/26/19 Potential to Achieve Goals: Good    Frequency Min 5X/week   Barriers to discharge Decreased caregiver support      Co-evaluation               AM-PAC PT "6 Clicks" Mobility  Outcome Measure Help needed turning from your back to your side while in a flat bed without using bedrails?: A Little Help needed moving from lying on your back to sitting on the side of a flat bed without using bedrails?: A Little Help needed moving to and from a bed to a chair (including a wheelchair)?: A Little Help needed standing up from a chair using your arms (e.g., wheelchair or bedside chair)?: A Little Help needed to walk in hospital room?: A Little Help needed climbing 3-5 steps with a railing? : A Lot 6 Click Score: 17    End of Session Equipment Utilized During Treatment: Gait belt Activity Tolerance: Patient tolerated treatment well Patient left: in chair;with call bell/phone within reach;with chair alarm set Nurse Communication: Mobility status PT Visit Diagnosis: Other abnormalities of gait and mobility (R26.89)    Time: [id number] PT Time Calculation (min) (ACUTE ONLY): 29 min   Charges:   PT Evaluation $PT Eval Moderate Complexity: 1 Mod PT Treatments $Therapeutic Activity: 8-22 mins        Deseri Loss P,  PT Acute Rehabilitation Services Pager: [phone] Office: [place] B Genevieve Ritzel 05/12/2019, 8:54 AM

## 2019-05-12 NOTE — Plan of Care (Signed)

## 2019-05-13 ENCOUNTER — Inpatient Hospital Stay (HOSPITAL_COMMUNITY): Payer: PPO

## 2019-05-13 ENCOUNTER — Other Ambulatory Visit: Payer: PPO

## 2019-05-13 ENCOUNTER — Encounter (HOSPITAL_COMMUNITY)
Admission: EM | Disposition: A | Discharge status: Home Health | Payer: Self-pay | Source: Home / Self Care | Attending: Internal Medicine

## 2019-05-13 ENCOUNTER — Inpatient Hospital Stay (HOSPITAL_COMMUNITY): Payer: PPO | Admitting: Anesthesiology

## 2019-05-13 HISTORY — PX: INTRAMEDULLARY (IM) NAIL INTERTROCHANTERIC: SHX5875

## 2019-05-13 LAB — CBC
HCT: 30.8 % — ABNORMAL LOW (ref 36.0–46.0)
Hemoglobin: 9.8 g/dL — ABNORMAL LOW (ref 12.0–15.0)
MCH: 29.6 pg (ref 26.0–34.0)
MCHC: 31.8 g/dL (ref 30.0–36.0)
MCV: 93.1 fL (ref 80.0–100.0)
Platelets: 139 10*3/uL — ABNORMAL LOW (ref 150–400)
RBC: 3.31 MIL/uL — ABNORMAL LOW (ref 3.87–5.11)
RDW: 13.2 % (ref 11.5–15.5)
WBC: 7.9 10*3/uL (ref 4.0–10.5)
nRBC: 0 % (ref 0.0–0.2)

## 2019-05-13 LAB — SURGICAL PCR SCREEN
MRSA, PCR: NEGATIVE
Staphylococcus aureus: NEGATIVE

## 2019-05-13 LAB — BASIC METABOLIC PANEL
Anion gap: 9 (ref 5–15)
BUN: 20 mg/dL (ref 8–23)
CO2: 29 mmol/L (ref 22–32)
Calcium: 8.9 mg/dL (ref 8.9–10.3)
Chloride: 101 mmol/L (ref 98–111)
Creatinine, Ser: 1.06 mg/dL — ABNORMAL HIGH (ref 0.44–1.00)
GFR calc Af Amer: 60 mL/min — ABNORMAL LOW (ref >60)
GFR calc non Af Amer: 52 mL/min — ABNORMAL LOW (ref >60)
Glucose, Bld: 91 mg/dL (ref 70–99)
Potassium: 4.1 mmol/L (ref 3.5–5.1)
Sodium: 139 mmol/L (ref 135–145)

## 2019-05-13 LAB — KAPPA/LAMBDA LIGHT CHAINS
Kappa free light chain: 19.4 mg/L (ref 3.3–19.4)
Kappa, lambda light chain ratio: 1.26 (ref 0.26–1.65)
Lambda free light chains: 15.4 mg/L (ref 5.7–26.3)

## 2019-05-13 SURGERY — FIXATION, FRACTURE, INTERTROCHANTERIC, WITH INTRAMEDULLARY ROD
Anesthesia: General | Laterality: Left

## 2019-05-13 MED ORDER — BUPIVACAINE HCL 0.5 % IJ SOLN
INTRAMUSCULAR | Status: DC | PRN
  Administered 2019-05-13: 20 mL

## 2019-05-13 MED ORDER — PROPOFOL 10 MG/ML IV BOLUS
INTRAVENOUS | Status: AC
  Filled 2019-05-13: qty 20

## 2019-05-13 MED ORDER — CEFAZOLIN SODIUM 1 G IJ SOLR
INTRAMUSCULAR | Status: AC
  Filled 2019-05-13: qty 20

## 2019-05-13 MED ORDER — ROCURONIUM BROMIDE 10 MG/ML (PF) SYRINGE
PREFILLED_SYRINGE | INTRAVENOUS | Status: DC | PRN
  Administered 2019-05-13: 50 mg via INTRAVENOUS
  Administered 2019-05-13: 20 mg via INTRAVENOUS

## 2019-05-13 MED ORDER — ENOXAPARIN SODIUM 40 MG/0.4ML ~~LOC~~ SOLN: 40.0000 mg | SUBCUTANEOUS | 0 refills | Status: DC

## 2019-05-13 MED ORDER — LIDOCAINE 2% (20 MG/ML) 5 ML SYRINGE
INTRAMUSCULAR | Status: DC | PRN
  Administered 2019-05-13: 40 mg via INTRAVENOUS

## 2019-05-13 MED ORDER — 0.9 % SODIUM CHLORIDE (POUR BTL) OPTIME
TOPICAL | Status: DC | PRN
  Administered 2019-05-13: 1000 mL

## 2019-05-13 MED ORDER — BUPIVACAINE HCL (PF) 0.5 % IJ SOLN
INTRAMUSCULAR | Status: AC
  Filled 2019-05-13: qty 30

## 2019-05-13 MED ORDER — FENTANYL CITRATE (PF) 100 MCG/2ML IJ SOLN: 25.0000 ug | INTRAMUSCULAR | Status: DC | PRN

## 2019-05-13 MED ORDER — FENTANYL CITRATE (PF) 250 MCG/5ML IJ SOLN
INTRAMUSCULAR | Status: AC
  Filled 2019-05-13: qty 5

## 2019-05-13 MED ORDER — DEXAMETHASONE SODIUM PHOSPHATE 10 MG/ML IJ SOLN
INTRAMUSCULAR | Status: DC | PRN
  Administered 2019-05-13: 4 mg via INTRAVENOUS

## 2019-05-13 MED ORDER — FENTANYL CITRATE (PF) 100 MCG/2ML IJ SOLN
INTRAMUSCULAR | Status: AC
  Administered 2019-05-13: 50 ug via INTRAVENOUS
  Filled 2019-05-13: qty 2

## 2019-05-13 MED ORDER — ONDANSETRON HCL 4 MG/2ML IJ SOLN
INTRAMUSCULAR | Status: DC | PRN
  Administered 2019-05-13: 4 mg via INTRAVENOUS

## 2019-05-13 MED ORDER — OXYCODONE HCL 5 MG PO TABS: 5.0000 mg | ORAL_TABLET | ORAL | 0 refills | Status: DC | PRN

## 2019-05-13 MED ORDER — LACTATED RINGERS IV SOLN: INTRAVENOUS | Status: DC | PRN

## 2019-05-13 MED ORDER — FENTANYL CITRATE (PF) 250 MCG/5ML IJ SOLN
INTRAMUSCULAR | Status: DC | PRN
  Administered 2019-05-13: 100 ug via INTRAVENOUS
  Administered 2019-05-13 (×2): 50 ug via INTRAVENOUS

## 2019-05-13 MED ORDER — ROCURONIUM BROMIDE 10 MG/ML (PF) SYRINGE
PREFILLED_SYRINGE | INTRAVENOUS | Status: AC
  Filled 2019-05-13: qty 10

## 2019-05-13 MED ORDER — VANCOMYCIN HCL 1000 MG IV SOLR
INTRAVENOUS | Status: AC
  Filled 2019-05-13: qty 1000

## 2019-05-13 MED ORDER — PROPOFOL 10 MG/ML IV BOLUS
INTRAVENOUS | Status: DC | PRN
  Administered 2019-05-13: 150 mg via INTRAVENOUS
  Administered 2019-05-13: 20 mg via INTRAVENOUS
  Administered 2019-05-13: 30 mg via INTRAVENOUS

## 2019-05-13 MED ORDER — LIDOCAINE 2% (20 MG/ML) 5 ML SYRINGE
INTRAMUSCULAR | Status: AC
  Filled 2019-05-13: qty 5

## 2019-05-13 MED ORDER — ONDANSETRON HCL 4 MG/2ML IJ SOLN: 4.0000 mg | Freq: Once | INTRAMUSCULAR | Status: DC | PRN

## 2019-05-13 MED ORDER — CEFAZOLIN SODIUM-DEXTROSE 2-4 GM/100ML-% IV SOLN
2.0000 g | Freq: Two times a day (BID) | INTRAVENOUS | Status: AC
  Administered 2019-05-13 – 2019-05-14 (×2): 2 g via INTRAVENOUS
  Filled 2019-05-13 (×2): qty 100

## 2019-05-13 MED ORDER — METHOCARBAMOL 500 MG PO TABS: 500.0000 mg | ORAL_TABLET | Freq: Four times a day (QID) | ORAL | 0 refills | Status: DC | PRN

## 2019-05-13 MED ORDER — CEFAZOLIN SODIUM-DEXTROSE 2-3 GM-%(50ML) IV SOLR
INTRAVENOUS | Status: DC | PRN
  Administered 2019-05-13: 2 g via INTRAVENOUS

## 2019-05-13 MED ORDER — PHENYLEPHRINE 40 MCG/ML (10ML) SYRINGE FOR IV PUSH (FOR BLOOD PRESSURE SUPPORT)
PREFILLED_SYRINGE | INTRAVENOUS | Status: DC | PRN
  Administered 2019-05-13 (×3): 80 ug via INTRAVENOUS

## 2019-05-13 MED ORDER — SUGAMMADEX SODIUM 200 MG/2ML IV SOLN
INTRAVENOUS | Status: DC | PRN
  Administered 2019-05-13: 150 mg via INTRAVENOUS

## 2019-05-13 MED ORDER — PHENYLEPHRINE HCL-NACL 10-0.9 MG/250ML-% IV SOLN
INTRAVENOUS | Status: DC | PRN
  Administered 2019-05-13: 40 ug/min via INTRAVENOUS

## 2019-05-13 SURGICAL SUPPLY — 48 items
BIT DRILL SHORT 4.0 (BIT) ×1 IMPLANT
BRUSH SCRUB EZ PLAIN DRY (MISCELLANEOUS) ×4 IMPLANT
CHLORAPREP W/TINT 26 (MISCELLANEOUS) ×2 IMPLANT
COVER PERINEAL POST (MISCELLANEOUS) ×2 IMPLANT
COVER SURGICAL LIGHT HANDLE (MISCELLANEOUS) ×2 IMPLANT
COVER WAND RF STERILE (DRAPES) IMPLANT
DERMABOND ADVANCED (GAUZE/BANDAGES/DRESSINGS) ×1
DERMABOND ADVANCED .7 DNX12 (GAUZE/BANDAGES/DRESSINGS) ×1 IMPLANT
DRAPE C-ARM 35X43 STRL (DRAPES) ×2 IMPLANT
DRAPE IMP U-DRAPE 54X76 (DRAPES) ×4 IMPLANT
DRAPE INCISE IOBAN 66X45 STRL (DRAPES) ×2 IMPLANT
DRAPE STERI IOBAN 125X83 (DRAPES) ×2 IMPLANT
DRAPE SURG 17X23 STRL (DRAPES) ×4 IMPLANT
DRAPE U-SHAPE 47X51 STRL (DRAPES) ×2 IMPLANT
DRILL BIT SHORT 4.0 (BIT) ×2
DRSG MEPILEX BORDER 4X4 (GAUZE/BANDAGES/DRESSINGS) ×2 IMPLANT
DRSG MEPILEX BORDER 4X8 (GAUZE/BANDAGES/DRESSINGS) ×2 IMPLANT
ELECT REM PT RETURN 9FT ADLT (ELECTROSURGICAL) ×2
ELECTRODE REM PT RTRN 9FT ADLT (ELECTROSURGICAL) ×1 IMPLANT
GLOVE BIO SURGEON STRL SZ 6.5 (GLOVE) ×6 IMPLANT
GLOVE BIO SURGEON STRL SZ7.5 (GLOVE) ×8 IMPLANT
GLOVE BIOGEL PI IND STRL 6.5 (GLOVE) ×1 IMPLANT
GLOVE BIOGEL PI IND STRL 7.5 (GLOVE) ×1 IMPLANT
GLOVE BIOGEL PI INDICATOR 6.5 (GLOVE) ×1
GLOVE BIOGEL PI INDICATOR 7.5 (GLOVE) ×1
GOWN STRL REUS W/ TWL LRG LVL3 (GOWN DISPOSABLE) ×1 IMPLANT
GOWN STRL REUS W/TWL LRG LVL3 (GOWN DISPOSABLE) ×2
GUIDE PIN 3.2X343 (PIN) ×2
GUIDE PIN 3.2X343MM (PIN) ×4
GUIDE ROD 3.0 (MISCELLANEOUS) ×2
KIT BASIN OR (CUSTOM PROCEDURE TRAY) ×2 IMPLANT
KIT TURNOVER KIT B (KITS) ×2 IMPLANT
MANIFOLD NEPTUNE II (INSTRUMENTS) ×2 IMPLANT
NAIL LOCK CANN 10X360 130D LT (Nail) ×2 IMPLANT
NS IRRIG 1000ML POUR BTL (IV SOLUTION) ×2 IMPLANT
PACK GENERAL/GYN (CUSTOM PROCEDURE TRAY) ×2 IMPLANT
PAD ARMBOARD 7.5X6 YLW CONV (MISCELLANEOUS) ×4 IMPLANT
PIN GUIDE 3.2X343MM (PIN) ×2 IMPLANT
ROD GUIDE 3.0 (MISCELLANEOUS) ×1 IMPLANT
SCREW LAG COMPR KIT 90/85 (Screw) ×2 IMPLANT
SCREW TRIGEN LOW PROF 5.0X40 (Screw) ×2 IMPLANT
SUT MNCRL AB 3-0 PS2 18 (SUTURE) ×2 IMPLANT
SUT VIC AB 0 CT1 27 (SUTURE)
SUT VIC AB 0 CT1 27XBRD ANBCTR (SUTURE) IMPLANT
SUT VIC AB 2-0 CT1 27 (SUTURE) ×4
SUT VIC AB 2-0 CT1 TAPERPNT 27 (SUTURE) ×2 IMPLANT
TOWEL GREEN STERILE (TOWEL DISPOSABLE) ×4 IMPLANT
WATER STERILE IRR 1000ML POUR (IV SOLUTION) ×2 IMPLANT

## 2019-05-13 NOTE — Progress Notes (Signed)
OT Cancellation Note  Patient Details Name: Yesenia Sullivan MRN: [id number] DOB: August 28, 1945   Cancelled Treatment:    Reason Eval/Treat Not Completed: Patient at procedure or test/ unavailable.  Pt underwent surgery this am, and is in PACU.  Will reattempt.  [name], OTR/L Acute Rehabilitation Services Pager [phone] Office [phone]   [name] M 05/13/2019, 9:50 AM

## 2019-05-13 NOTE — Anesthesia Preprocedure Evaluation (Signed)
Anesthesia Evaluation  Patient identified by MRN, date of birth, ID band Patient awake    Reviewed: Allergy & Precautions, NPO status , Patient's Chart, lab work & pertinent test results  Airway Mallampati: II  TM Distance: >3 FB Neck ROM: Full    Dental  (+) Teeth Intact, Dental Advisory Given   Pulmonary    breath sounds clear to auscultation       Cardiovascular  Rhythm:Regular Rate:Normal     Neuro/Psych    GI/Hepatic   Endo/Other    Renal/GU      Musculoskeletal   Abdominal   Peds  Hematology   Anesthesia Other Findings   Reproductive/Obstetrics                             Anesthesia Physical Anesthesia Plan  ASA: III  Anesthesia Plan: General   Post-op Pain Management:    Induction: Intravenous  PONV Risk Score and Plan: Ondansetron and Dexamethasone  Airway Management Planned: Oral ETT  Additional Equipment:   Intra-op Plan:   Post-operative Plan: Extubation in OR  Informed Consent:     Dental advisory given  Plan Discussed with: Anesthesiologist and CRNA  Anesthesia Plan Comments:         Anesthesia Quick Evaluation

## 2019-05-13 NOTE — Op Note (Signed)
Orthopaedic Surgery Operative Note (CSN: [id number] ) Date of Surgery: 05/13/2019  Admit Date: 05/10/2019   Diagnoses: Pre-Op Diagnoses: Left pathologic lesion of femur   Post-Op Diagnosis: Same  Procedures: CPT 27495-Prophylactic nailing of left femur  Surgeons : Primary: Shona Needles, MD  Assistant: Patrecia Pace, PA-C  Location: OR 5   Anesthesia:General  Antibiotics: Ancef 2g preop   Tourniquet time: None  Estimated Blood Loss: [id number]  Complications:None   Specimens:None   Implants: Implant Name Type Inv. Item Serial No. Manufacturer Lot No. LRB No. Used Action  NAIL LOCK CANN 10X360 130D LT - [id number] Nail NAIL LOCK CANN 10X360 130D LT  [employer] [id number] Left 1 Implanted  SCREW LAG COMBO 90.85 - [id number] Screw SCREW LAG COMBO 90.85  [employer] [id number] Left 1 Implanted  SCREW TRIGEN LOW PROF 5.0X40 - [id number] Screw SCREW TRIGEN LOW PROF 5.0X40  [employer] [id number] Left 1 Implanted     Indications for Surgery: 74 year old female who sustained a pathologic right femur fracture that I treated with open reduction internal fixation on 05/11/2019.  She has been having weightbearing pain in her left thigh for the last few weeks.  CT of the femur showed lesions both in the trochanteric region as well as the diaphysis.  Due to the lytic nature of the lesions in the location I recommended prophylactic nailing especially as she will be weightbearing on that leg for recovery from her right leg.  Risks and benefits were discussed with the patient.  Risks included but not limited to bleeding, infection, fracture, fat emboli, pain, weakness, nerve and blood vessel injury, DVT, even the possibility of anesthetic complications.  She agreed to proceed with surgery and consent was obtained  Operative Findings: Prophylactic nailing of the left femoral shaft using [employer] InterTAN 10 x 360 mm.  Procedure: The patient was  identified in the preoperative holding area. Consent was confirmed with the patient and their family and all questions were answered. The operative extremity was marked after confirmation with the patient. she was then brought back to the operating room by our anesthesia colleagues.  She was placed under general anesthetic and carefully transferred over to a Hana table.  The left lower extremity was then prepped and draped in usual sterile fashion timeout was performed to verify the patient, the procedure, and the extremity.  Preoperative antibiotics were dosed.  Fluoroscopic imaging was obtained to show the cortical thinning of the trochanteric region.  A incision was made just proximal to the greater trochanter.  I split the gluteus muscle and fascia in line with my incision.  I then directed a threaded guidewire and into the greater trochanter entering the metaphysis.  An entry reamer was used to enter the medullary canal after confirming placement of the guidewire with AP and lateral fluoroscopic imaging.  I then passed a ball-tipped guidewire down the center of the canal.  Prior to placing the ball-tipped guidewire I made a percutaneous incision along the femoral shaft and unicortical he drilled through the lateral cortex to provide a vent hole for the reaming and the passage of the nail.  The ball-tipped guidewire was seated down into the distal metaphysis and I measured and decided to place a 360 mm nail.  I reamed from 9 mm to 11.5 mm and placed a 10 mm nail.  It was seated until it was flush with the greater trochanter.  I then made an incision and using the drop  guide I directed a threaded guidewire into the head neck region.  I confirmed adequate tip apex distance with fluoroscopic imaging.  I drilled the path for the compression screw and placed the antirotation bar.  I then drilled the path for the lag screw and proceeded to place the 90 mm lag screw.  I remove the antirotation bar and placed the  compression screw.  I statically locked the nail and confirmed positioning with AP and lateral fluoroscopic imaging.  Perfect circle technique was used to place 1 distal interlocking screw from lateral to medial.  The jig was removed and final fluoroscopic imaging was obtained.  The incisions were copiously irrigated a layer closure of 0 Vicryl, 2-0 Vicryl and 3-0 Monocryl was used.  Sterile dressings were placed.  This was after local anesthetic was injected into the incisions.  The patient was then awoken from anesthesia and taken to the PACU in stable condition.  Post Op Plan/Instructions: The patient will be weightbearing as tolerated to left lower extremity.  Touchdown weightbearing to the right lower extremity.  Continue receive IV Ancef for 24 hours postoperatively.  Will recommend Lovenox for DVT prophylaxis if hemoglobin is stable tomorrow.  Continue to mobilize with physical and Occupational Therapy.  I was present and performed the entire surgery.  Patrecia Pace, PA-C did assist me throughout the case. An assistant was necessary given the difficulty in approach, maintenance of reduction and ability to instrument the fracture.   Katha Hamming, MD [facility]

## 2019-05-13 NOTE — Progress Notes (Signed)
Ortho Trauma Note  Risks and benefits discussed with patient. She agrees to proceed with surgery and consent obtained. Will be WBAT LLE postoperatively.  Shona Needles, MD [facility] [phone] (office) [email or web address]

## 2019-05-13 NOTE — Transfer of Care (Signed)
Immediate Anesthesia Transfer of Care Note  Patient: Yesenia Sullivan  Procedure(s) Performed: INTRAMEDULLARY (IM) NAIL INTERTROCHANTRIC (Left )  Patient Location: PACU  Anesthesia Type:General  Level of Consciousness: awake, alert  and oriented  Airway & Oxygen Therapy: Patient Spontanous Breathing and Patient connected to nasal cannula oxygen  Post-op Assessment: Report given to RN, Post -op Vital signs reviewed and stable and Patient moving all extremities  Post vital signs: Reviewed and stable  Last Vitals:  Vitals Value Taken Time  BP    Temp    Pulse 65 05/13/19 0944  Resp 9 05/13/19 0944  SpO2 97 % 05/13/19 0944  Vitals shown include unvalidated device data.  Last Pain:  Vitals:   05/13/19 0500  TempSrc:   PainSc: 0-No pain      Patients Stated Pain Goal: 4 ([id number])  Complications: No apparent anesthesia complications

## 2019-05-13 NOTE — Anesthesia Postprocedure Evaluation (Signed)
Anesthesia Post Note  Patient: Yesenia Sullivan  Procedure(s) Performed: INTRAMEDULLARY (IM) NAIL INTERTROCHANTRIC (Left )     Patient location during evaluation: PACU Anesthesia Type: General Level of consciousness: awake and alert Pain management: pain level controlled Vital Signs Assessment: post-procedure vital signs reviewed and stable Respiratory status: spontaneous breathing, nonlabored ventilation, respiratory function stable and patient connected to nasal cannula oxygen Cardiovascular status: blood pressure returned to baseline and stable Postop Assessment: no apparent nausea or vomiting Anesthetic complications: no    Last Vitals:  Vitals:   05/13/19 1040 05/13/19 1115  BP: (!) 142/69 (!) 179/82  Pulse: 73 80  Resp: 18   Temp: 36.7 C 36.9 C  SpO2: 100% 100%    Last Pain:  Vitals:   05/13/19 1040  TempSrc:   PainSc: 4                  Drexler Maland COKER

## 2019-05-13 NOTE — Anesthesia Procedure Notes (Signed)
Procedure Name: Intubation Date/Time: 05/13/2019 7:39 AM Performed by: Amadeo Garnet, CRNA Pre-anesthesia Checklist: Patient identified, Emergency Drugs available, Suction available and Patient being monitored Patient Re-evaluated:Patient Re-evaluated prior to induction Oxygen Delivery Method: Circle system utilized Preoxygenation: Pre-oxygenation with 100% oxygen Induction Type: IV induction Ventilation: Mask ventilation without difficulty Laryngoscope Size: Mac and 3 Grade View: Grade I Tube type: Oral Tube size: 7.0 mm Number of attempts: 1 Airway Equipment and Method: Stylet Secured at: 22 cm Tube secured with: Tape Dental Injury: Teeth and Oropharynx as per pre-operative assessment

## 2019-05-13 NOTE — Progress Notes (Signed)
PROGRESS NOTE    Zykera Mross  [id number] DOB: 21-Feb-1945 DOA: 05/10/2019 PCP: Patient, No Pcp Per   Brief Narrative:  Patient is a 56 female with no significant past medical history who presented to the emergency department on 3/23 with complaints of left lower extremity pain, swelling.   She also reported noticing of right breast lump about a year ago. On 3/17, she had presented to the emergency department, at the time MRI of the brain showed findings suspicious for osseous metastatic lesion. She wanted to be discharged and follow as an outpatient She has been seen by her physician on 3/23 and has been referred to breast clinic for further evaluation. On the way out of her PCPs office, she suddenly felt a pop on her right thigh followed by severe pain and obvious deformity.  X-ray done in the emergency room showed right femur fracture which was severely displaced and angulated. CT scan of the left femur showed lytic lesion in the peritrochanteric region with more than one third of the femur involved. Orthopedics consulted.  Underwent nailing of left femur on 05/13/2019. Initiated breast cancer work-up.  Assessment & Plan:   Active Problems:   Closed femur fracture (HCC)   Right closed femur fracture: Likely pathological fracture. Imaging findings as above.Underwent  nailing of the left femur. Continue pain management.  PT/OT evaluation .  Elevated blood pressure: Most likely secondary to pain. Continue to monitor blood pressure. Continue as needed medication.No H/O HTN  Right breast mass with evidence of  metastasis: She had felt right breast lump a year ago but did not want to seek medical attention. She has been followed by her PCP and has been referred to breast clinic. MRI of the brain done on 3/17 due to pain on extremities, trouble walking. It showed 13 mm focus of T2 and diffusion-weighted signal hyperintensity in the right frontoparietal area, suspicious for metastatic  disease. Case has been discussed with Dr.Zhao, oncology.CT chest/abdomen/pelvis with contrast done here showed large left-sided breast mass, diffuse metastatic skeletal lesions with pathological fractures of the ribs, hepatic metastasis. Plan for liver biopsy for diagnosis.  She will also undergo MRI of the brain with and without contrast.  Constipation: Continue bowel regimen         DVT prophylaxis:Lovenox Code Status: Full code Family Communication:  Disposition Plan: Patient is from home. Likely needs rehab/nursing facility with femur fracture. Waiting for PT/OT evaluation.  Also waiting for cancer work-up   Consultants: Orthopedics, oncology  Procedures: Nailing of right femur  Antimicrobials:  Anti-infectives (From admission, onward)   Start     Dose/Rate Route Frequency Ordered Stop   05/11/19 2000  ceFAZolin (ANCEF) IVPB 2g/100 mL premix     2 g 200 mL/hr over 30 Minutes Intravenous Every 8 hours 05/11/19 1555 05/12/19 1242   05/11/19 1345  vancomycin (VANCOCIN) powder  Status:  Discontinued       As needed 05/11/19 1346 05/11/19 1419   05/11/19 1159  ceFAZolin (ANCEF) 2-4 GM/100ML-% IVPB    Note to Pharmacy: [name]   : cabinet override      05/11/19 1159 05/11/19 2206      Subjective: Patient seen and examined at the bedside this afternoon.  Pain is well controlled.  She is agreeable to work-up for breast cancer but she is not sure about pursuing treatment if it confirms cancer.  Denies any other problems.  Objective: Vitals:   05/12/19 0830 05/12/19 1300 05/12/19 1957 05/13/19 0342  BP: 127/66 128/70 Marland Kitchen)  114/56 120/68  Pulse: 84 82 70 (!) 57  Resp: 18 17 17 17   Temp: 98.1 F (36.7 C) 98.2 F (36.8 C) 98.3 F (36.8 C) 98 F (36.7 C)  TempSrc: Oral Oral Oral Oral  SpO2: 96% 97% 91% 92%  Weight:      Height:        Intake/Output Summary (Last 24 hours) at 05/13/2019 0859 Last data filed at 05/13/2019 0856 Gross per 24 hour  Intake 1930 ml  Output  --  Net 1930 ml   Filed Weights   05/10/19 1416 05/11/19 1115  Weight: 74.4 kg 73.9 kg    Examination:  General exam: Comfortable, pleasant elderly female HEENT:PERRL,Oral mucosa moist, Ear/Nose normal on gross exam Respiratory system: Bilateral equal air entry, normal vesicular breath sounds, no wheezes or crackles  Cardiovascular system: S1 & S2 heard, RRR. No JVD, murmurs, rubs, gallops or clicks. No pedal edema. Large hard breast mass on the right side with erythematous change/small ulcer on the overlying skin Gastrointestinal system: Abdomen is nondistended, soft and nontender. No organomegaly or masses felt. Normal bowel sounds heard. Central nervous system: Alert and oriented. No focal neurological deficits. Extremities: No edema, no clubbing ,no cyanosis, right lower extremity wrapped with dressing  skin: No rashes, lesions or ulcers,no icterus ,no pallor    Data Reviewed: I have personally reviewed following labs and imaging studies  CBC: Recent Labs  Lab 05/10/19 1530 05/11/19 0333 05/12/19 0433 05/13/19 0406  WBC 11.2* 8.8 8.7 7.9  NEUTROABS 10.1*  --   --   --   HGB 12.6 12.1 10.6* 9.8*  HCT 38.2 37.2 32.1* 30.8*  MCV 90.7 92.5 91.7 93.1  PLT 180 159 150 [id number]*   Basic Metabolic Panel: Recent Labs  Lab 05/10/19 1530 05/11/19 0333 05/12/19 0433 05/13/19 0406  NA 139 139 138 139  K 3.9 4.0 5.0 4.1  CL 102 104 101 101  CO2 25 26 27 29   GLUCOSE 130* 91 108* 91  BUN 27* 31* 20 20  CREATININE 0.74 0.96 0.90 1.06*  CALCIUM 9.1 9.0 8.6* 8.9   GFR: Estimated Creatinine Clearance: 44.5 mL/min (A) (by C-G formula based on SCr of 1.06 mg/dL (H)). Liver Function Tests: Recent Labs  Lab 05/10/19 1530 05/12/19 0939  AST 51* 93*  ALT 20 68*  ALKPHOS 168* 161*  BILITOT 0.6 0.5  PROT 7.6 6.3*  ALBUMIN 4.4 3.3*   No results for input(s): LIPASE, AMYLASE in the last 168 hours. No results for input(s): AMMONIA in the last 168 hours. Coagulation Profile: No  results for input(s): INR, PROTIME in the last 168 hours. Cardiac Enzymes: No results for input(s): CKTOTAL, CKMB, CKMBINDEX, TROPONINI in the last 168 hours. BNP (last 3 results) No results for input(s): PROBNP in the last 8760 hours. HbA1C: No results for input(s): HGBA1C in the last 72 hours. CBG: No results for input(s): GLUCAP in the last 168 hours. Lipid Profile: No results for input(s): CHOL, HDL, LDLCALC, TRIG, CHOLHDL, LDLDIRECT in the last 72 hours. Thyroid Function Tests: No results for input(s): TSH, T4TOTAL, FREET4, T3FREE, THYROIDAB in the last 72 hours. Anemia Panel: No results for input(s): VITAMINB12, FOLATE, FERRITIN, TIBC, IRON, RETICCTPCT in the last 72 hours. Sepsis Labs: No results for input(s): PROCALCITON, LATICACIDVEN in the last 168 hours.  Recent Results (from the past 240 hour(s))  SARS CORONAVIRUS 2 (TAT 6-24 HRS) Nasopharyngeal Nasopharyngeal Swab     Status: None   Collection Time: 05/04/19  3:53 PM   Specimen: Nasopharyngeal  Swab  Result Value Ref Range Status   SARS Coronavirus 2 NEGATIVE NEGATIVE Final    Comment: (NOTE) SARS-CoV-2 target nucleic acids are NOT DETECTED. The SARS-CoV-2 RNA is generally detectable in upper and lower respiratory specimens during the acute phase of infection. Negative results do not preclude SARS-CoV-2 infection, do not rule out co-infections with other pathogens, and should not be used as the sole basis for treatment or other patient management decisions. Negative results must be combined with clinical observations, patient history, and epidemiological information. The expected result is Negative. Fact Sheet for Patients: SugarRoll.be Fact Sheet for Healthcare Providers: https://www.woods-mathews.com/ This test is not yet approved or cleared by the [place] FDA and  has been authorized for detection and/or diagnosis of SARS-CoV-2 by FDA under an Emergency Use  Authorization (EUA). This EUA will remain  in effect (meaning this test can be used) for the duration of the COVID-19 declaration under Section 56 4(b)(1) of the Act, 21 U.S.C. section 360bbb-3(b)(1), unless the authorization is terminated or revoked sooner. Performed at [facility], [place] [address], [place], [place] [address]   Respiratory Panel by RT PCR (Flu A&B, Covid) - Nasopharyngeal Swab     Status: None   Collection Time: 05/10/19  4:46 PM   Specimen: Nasopharyngeal Swab  Result Value Ref Range Status   SARS Coronavirus 2 by RT PCR NEGATIVE NEGATIVE Final    Comment: (NOTE) SARS-CoV-2 target nucleic acids are NOT DETECTED. The SARS-CoV-2 RNA is generally detectable in upper respiratoy specimens during the acute phase of infection. The lowest concentration of SARS-CoV-2 viral copies this assay can detect is 131 copies/mL. A negative result does not preclude SARS-Cov-2 infection and should not be used as the sole basis for treatment or other patient management decisions. A negative result may occur with  improper specimen collection/handling, submission of specimen other than nasopharyngeal swab, presence of viral mutation(s) within the areas targeted by this assay, and inadequate number of viral copies (<131 copies/mL). A negative result must be combined with clinical observations, patient history, and epidemiological information. The expected result is Negative. Fact Sheet for Patients:  PinkCheek.be Fact Sheet for Healthcare Providers:  GravelBags.it This test is not yet ap proved or cleared by the [place] FDA and  has been authorized for detection and/or diagnosis of SARS-CoV-2 by FDA under an Emergency Use Authorization (EUA). This EUA will remain  in effect (meaning this test can be used) for the duration of the COVID-19 declaration under Section 564(b)(1) of the Act, 21 U.S.C. section 360bbb-3(b)(1),  unless the authorization is terminated or revoked sooner.    Influenza A by PCR NEGATIVE NEGATIVE Final   Influenza B by PCR NEGATIVE NEGATIVE Final    Comment: (NOTE) The Xpert Xpress SARS-CoV-2/FLU/RSV assay is intended as an aid in  the diagnosis of influenza from Nasopharyngeal swab specimens and  should not be used as a sole basis for treatment. Nasal washings and  aspirates are unacceptable for Xpert Xpress SARS-CoV-2/FLU/RSV  testing. Fact Sheet for Patients: PinkCheek.be Fact Sheet for Healthcare Providers: GravelBags.it This test is not yet approved or cleared by the [place] FDA and  has been authorized for detection and/or diagnosis of SARS-CoV-2 by  FDA under an Emergency Use Authorization (EUA). This EUA will remain  in effect (meaning this test can be used) for the duration of the  Covid-19 declaration under Section 564(b)(1) of the Act, 21  U.S.C. section 360bbb-3(b)(1), unless the authorization is  terminated or revoked. Performed at  [facility], [place] [address] [place], [place] [address]          Radiology Studies: CT CHEST W CONTRAST  Result Date: 05/12/2019 CLINICAL DATA:  Metastatic disease, staging and assessment for primary. EXAM: CT CHEST, ABDOMEN, AND PELVIS WITH CONTRAST TECHNIQUE: Multidetector CT imaging of the chest, abdomen and pelvis was performed following the standard protocol during bolus administration of intravenous contrast. CONTRAST:  178mL OMNIPAQUE IOHEXOL 300 MG/ML  SOLN COMPARISON:  Chest radiograph 04/12/2019 FINDINGS: CT CHEST FINDINGS Cardiovascular: Atherosclerotic calcification of the aortic arch. Mediastinum/Nodes: No pathologic thoracic adenopathy is observed. Lungs/Pleura: Likely subpleural lymph node along the minor fissure measuring 0.7 by 0.6 by 0.2 cm shown on image 70/6. 2 mm right middle lobe nodule on image 90/4. Mild dependent atelectasis in the  posterior basal segment right lower lobe. Trace adjacent right pleural effusion. 3 mm right lower lobe nodule, image 97/4. 3 mm lingular nodule, image 92/4. 0.4 by 0.3 cm left lower lobe subpleural nodule, image 111/4. 0.5 by 0.4 cm left lower lobe nodule, image 103/4. Musculoskeletal: 6.4 by 4.9 by 5.8 cm (volume = 95 cm^3) enhancing mass in the right upper breast extending to the cutaneous surface. This abuts the superficial fascia margin of the pectoralis major muscle but I do not observe definite intramuscular invasion. Scattered osseous metastatic disease. Pathologic fracture of the base of the right acromion, image 6/3. Likely pathologic fracture of the inferior tip of the left scapula with surrounding osteoid formation, image 29/3. Numerous scattered bilateral rib lesions, many with pathologic fractures. Permeative mass involving the sternal manubrium. Multilevel vertebral involvement most notable at T9, and T7. Suspected pathologic fracture of the T7 spinous process. CT ABDOMEN PELVIS FINDINGS Hepatobiliary: Indistinct scattered metastatic lesions are present throughout the liver. Index segment 2 lesion 2.2 by 2.0 cm on image 46/3. Index right hepatic lobe lesion 2.9 by 2.6 cm on image 60/3. Multiple additional scattered lesions compatible with metastatic disease observed. Contracted gallbladder containing gallstones. Mild intrahepatic and extrahepatic biliary dilatation, cause uncertain. Pancreas: Unremarkable Spleen: Unremarkable Adrenals/Urinary Tract: Two nonobstructive left kidney lower pole calculi are present, one measuring 1.1 cm in long axis in the other measuring the same. 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 The ureters and urinary bladder partially obscured by streak artifact from the patient's right hip implant. There appears to be gas anteriorly in the urinary bladder, query recent catheterization. Left mid kidney cyst posteriorly.  Stomach/Bowel: Sigmoid colon diverticulosis. Scattered diverticula of the descending colon. Vascular/Lymphatic: Aortoiliac atherosclerotic vascular disease. No pathologic adenopathy identified. Reproductive: Uterus absent.  Adnexa unremarkable. Other: Small locules of gas in the subcutaneous tissues of the right lower quadrant abdomen, possibly injection related. Musculoskeletal: Right total hip prosthesis. Permeative lesion in the right iliac crest with suspected nondisplaced pathologic fracture extending vertically in the right iliac bone. Lytic lesions in the left iliac crest and anterior superior left iliac spine. Primarily lytic lesions in the left upper sacrum. Primarily lytic lesions in the lumbar spine, including for example the right transverse process of L5. Grade 1 degenerative retrolisthesis at T11-12, T12-L1, and L1-2 with mild grade 1 anterolisthesis at L3-4 and L4-5. IMPRESSION: 1. Large (95 cubic cm) right upper breast mass likely representing recurrent breast cancer. Extensive scattered metastatic lesions throughout the skeleton, with various pathologic fractures primarily involving the ribs and bilateral scapula as well as the right iliac bone. 2. Scattered masses in the liver are most compatible with metastatic disease. 3. There  are a few scattered tiny pulmonary nodules which are nonspecific. 4. Contracted gallbladder containing gallstones. Mild intrahepatic and extrahepatic biliary dilatation, cause uncertain. 5. Nonobstructive left nephrolithiasis. 6. 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 7. Other imaging findings of potential clinical significance: Aortic Atherosclerosis (ICD10-I70.0). Sigmoid colon diverticulosis. Multilevel lumbar spondylosis and degenerative disc disease. Trace right pleural effusion. Electronically Signed   By: Van Clines M.D.   On: 05/12/2019 15:35   CT ABDOMEN PELVIS W CONTRAST  Result Date: 05/12/2019 CLINICAL DATA:  Metastatic  disease, staging and assessment for primary. EXAM: CT CHEST, ABDOMEN, AND PELVIS WITH CONTRAST TECHNIQUE: Multidetector CT imaging of the chest, abdomen and pelvis was performed following the standard protocol during bolus administration of intravenous contrast. CONTRAST:  155mL OMNIPAQUE IOHEXOL 300 MG/ML  SOLN COMPARISON:  Chest radiograph 04/12/2019 FINDINGS: CT CHEST FINDINGS Cardiovascular: Atherosclerotic calcification of the aortic arch. Mediastinum/Nodes: No pathologic thoracic adenopathy is observed. Lungs/Pleura: Likely subpleural lymph node along the minor fissure measuring 0.7 by 0.6 by 0.2 cm shown on image 70/6. 2 mm right middle lobe nodule on image 90/4. Mild dependent atelectasis in the posterior basal segment right lower lobe. Trace adjacent right pleural effusion. 3 mm right lower lobe nodule, image 97/4. 3 mm lingular nodule, image 92/4. 0.4 by 0.3 cm left lower lobe subpleural nodule, image 111/4. 0.5 by 0.4 cm left lower lobe nodule, image 103/4. Musculoskeletal: 6.4 by 4.9 by 5.8 cm (volume = 95 cm^3) enhancing mass in the right upper breast extending to the cutaneous surface. This abuts the superficial fascia margin of the pectoralis major muscle but I do not observe definite intramuscular invasion. Scattered osseous metastatic disease. Pathologic fracture of the base of the right acromion, image 6/3. Likely pathologic fracture of the inferior tip of the left scapula with surrounding osteoid formation, image 29/3. Numerous scattered bilateral rib lesions, many with pathologic fractures. Permeative mass involving the sternal manubrium. Multilevel vertebral involvement most notable at T9, and T7. Suspected pathologic fracture of the T7 spinous process. CT ABDOMEN PELVIS FINDINGS Hepatobiliary: Indistinct scattered metastatic lesions are present throughout the liver. Index segment 2 lesion 2.2 by 2.0 cm on image 46/3. Index right hepatic lobe lesion 2.9 by 2.6 cm on image 60/3. Multiple  additional scattered lesions compatible with metastatic disease observed. Contracted gallbladder containing gallstones. Mild intrahepatic and extrahepatic biliary dilatation, cause uncertain. Pancreas: Unremarkable Spleen: Unremarkable Adrenals/Urinary Tract: Two nonobstructive left kidney lower pole calculi are present, one measuring 1.1 cm in long axis in the other measuring the same. 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 The ureters and urinary bladder partially obscured by streak artifact from the patient's right hip implant. There appears to be gas anteriorly in the urinary bladder, query recent catheterization. Left mid kidney cyst posteriorly. Stomach/Bowel: Sigmoid colon diverticulosis. Scattered diverticula of the descending colon. Vascular/Lymphatic: Aortoiliac atherosclerotic vascular disease. No pathologic adenopathy identified. Reproductive: Uterus absent.  Adnexa unremarkable. Other: Small locules of gas in the subcutaneous tissues of the right lower quadrant abdomen, possibly injection related. Musculoskeletal: Right total hip prosthesis. Permeative lesion in the right iliac crest with suspected nondisplaced pathologic fracture extending vertically in the right iliac bone. Lytic lesions in the left iliac crest and anterior superior left iliac spine. Primarily lytic lesions in the left upper sacrum. Primarily lytic lesions in the lumbar spine, including for example the right transverse process of L5. Grade 1 degenerative retrolisthesis at T11-12, T12-L1, and L1-2 with mild grade 1 anterolisthesis at  L3-4 and L4-5. IMPRESSION: 1. Large (95 cubic cm) right upper breast mass likely representing recurrent breast cancer. Extensive scattered metastatic lesions throughout the skeleton, with various pathologic fractures primarily involving the ribs and bilateral scapula as well as the right iliac bone. 2. Scattered masses in the liver are most compatible with  metastatic disease. 3. There are a few scattered tiny pulmonary nodules which are nonspecific. 4. Contracted gallbladder containing gallstones. Mild intrahepatic and extrahepatic biliary dilatation, cause uncertain. 5. Nonobstructive left nephrolithiasis. 6. 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 7. Other imaging findings of potential clinical significance: Aortic Atherosclerosis (ICD10-I70.0). Sigmoid colon diverticulosis. Multilevel lumbar spondylosis and degenerative disc disease. Trace right pleural effusion. Electronically Signed   By: Van Clines M.D.   On: 05/12/2019 15:35   CT FEMUR LEFT W CONTRAST  Result Date: 05/12/2019 CLINICAL DATA:  Pathologic right femur fracture. Abnormal left femur x-ray EXAM: CT OF THE LOWER LEFT EXTREMITY WITH CONTRAST TECHNIQUE: Multidetector CT imaging of the lower left extremity was performed according to the standard protocol following intravenous contrast administration. COMPARISON:  Left femur x-ray 05/11/2019 CONTRAST:  192mL OMNIPAQUE IOHEXOL 300 MG/ML  SOLN FINDINGS: Bones/Joint/Cartilage At the cranial most axial image, there is partial visualization of a healing fracture involving the anterior left iliac crest (series 3, image 1). The bone at the fracture site appears lucent and irregular which could reflect an underlying osseous lesion versus changes related to fracture. Eccentrically located lytic bone lesion within the anterolateral aspect of the subtrochanteric proximal left femur with endosteal scalloping and cortical breakthrough. Lesion measures approximately 3.5 x 2.8 cm (series 7, images 65-66; series 3, images 88-96). It is difficult to accurately assess the percentage of the medullary space occupied by this lesion on CT imaging. Suspect additional lesion within the distal femoral diaphysis where there is subtle endosteal scalloping of the lateral cortex and increased density within the marrow space measuring approximately  2.5 cm (series 7, image 66). Alignment at the hip and knee are maintained without dislocation. Ligaments Suboptimally assessed by CT. Muscles and Tendons No myotendinous abnormality is seen. Soft tissues No well-defined soft tissue mass. No fluid collection or hematoma. No inguinal lymphadenopathy. Sigmoid diverticulosis. IMPRESSION: 1. Eccentrically located lytic bone lesion within the anterolateral aspect of the subtrochanteric proximal left femur with endosteal scalloping and cortical breakthrough. Lesion is at risk for pathologic fracture. 2. Suspect additional lesion within the distal femoral diaphysis where there is subtle endosteal scalloping of the lateral cortex and increased density within the marrow space. These findings are suspicious for metastatic disease. 3. Partial visualization of a healing fracture involving the anterior left iliac crest. The bone at the fracture site appears lucent and irregular concerning for an underlying bone lesion. Electronically Signed   By: Davina Poke D.O.   On: 05/12/2019 15:22   DG Knee Right Port  Result Date: 05/11/2019 CLINICAL DATA:  Fracture, postop. EXAM: PORTABLE RIGHT KNEE - 1-2 VIEW COMPARISON:  Preoperative radiograph 05/10/2019. Concurrent femur radiograph, reported separately. FINDINGS: Lateral plate and multi screw fixation of displaced distal femoral shaft fracture. Fracture is in improved alignment compared to preoperative imaging. Proximal aspect of the hardware is included on concurrent femur exam. Recent postsurgical change includes air and edema in the soft tissues. IMPRESSION: Lateral plate and screw fixation of displaced distal femoral shaft fracture, improved alignment compared to preoperative imaging. No medial postoperative complication. Electronically Signed   By: Keith Rake M.D.   On: 05/11/2019 15:49   DG Humerus Left  Result Date: 05/11/2019 CLINICAL DATA:  Fracture. EXAM: LEFT HUMERUS - 2+ VIEW COMPARISON:  None. FINDINGS:  Cortical margins of the humerus are intact. There is no evidence of fracture or other focal bone lesions. Shoulder and elbow alignment grossly maintained. Soft tissues are unremarkable. Blood pressure cuff overlies the arm and IV in the antecubital fossa. IMPRESSION: Negative radiographs of the left humerus. Electronically Signed   By: Keith Rake M.D.   On: 05/11/2019 15:50   DG Humerus Right  Result Date: 05/11/2019 CLINICAL DATA:  Fracture. EXAM: RIGHT HUMERUS - 2+ VIEW COMPARISON:  None. FINDINGS: Cortical margins of the humerus are intact. There is no evidence of fracture or other focal bone lesions. Shoulder and elbow alignment are grossly maintained. Soft tissues are unremarkable. IMPRESSION: Negative radiographs of the right humerus. Electronically Signed   By: Keith Rake M.D.   On: 05/11/2019 15:52   DG C-Arm 1-60 Min  Result Date: 05/11/2019 CLINICAL DATA:  ORIF of right femur. FLUOROSCOPY TIME:  1 minutes 58 seconds. Images: 10 EXAM: RIGHT FEMUR 2 VIEWS COMPARISON:  None. FINDINGS: A displaced distal femoral diaphysis fracture seen at the beginning of the study. Throughout the study, the fracture was reduced and a sideplate was affixed to the distal femur, crossing the fracture. The patient is status post right hip replacement as well. Only the distal femoral stem is visualized of the right hip replacement. IMPRESSION: Right distal femoral fracture repair as above. Electronically Signed   By: Dorise Bullion III M.D   On: 05/11/2019 16:01   DG FEMUR, MIN 2 VIEWS RIGHT  Result Date: 05/11/2019 CLINICAL DATA:  ORIF of right femur. FLUOROSCOPY TIME:  1 minutes 58 seconds. Images: 10 EXAM: RIGHT FEMUR 2 VIEWS COMPARISON:  None. FINDINGS: A displaced distal femoral diaphysis fracture seen at the beginning of the study. Throughout the study, the fracture was reduced and a sideplate was affixed to the distal femur, crossing the fracture. The patient is status post right hip replacement as  well. Only the distal femoral stem is visualized of the right hip replacement. IMPRESSION: Right distal femoral fracture repair as above. Electronically Signed   By: Dorise Bullion III M.D   On: 05/11/2019 15:59   DG FEMUR, MIN 2 VIEWS RIGHT  Result Date: 05/11/2019 CLINICAL DATA:  Right femur fracture, postop. EXAM: RIGHT FEMUR 2 VIEWS COMPARISON:  Prior operative radiograph yesterday. FINDINGS: Lateral plate and multi screw fixation of distal femoral shaft fracture. Fracture is in improved alignment compared to preoperative imaging. Right hip arthroplasty appears intact. Recent postsurgical change includes air and edema in the soft tissues. IMPRESSION: Post ORIF distal right femur fracture in improved alignment compared to preoperative imaging. No immediate postoperative complications. Electronically Signed   By: Keith Rake M.D.   On: 05/11/2019 15:54   DG FEMUR PORT MIN 2 VIEWS LEFT  Result Date: 05/11/2019 CLINICAL DATA:  Fracture. Recent right femur fracture post surgical fixation. EXAM: LEFT FEMUR PORTABLE 2 VIEWS COMPARISON:  None. FINDINGS: No evidence of left femur fracture. There is questionable cortical thinning involving the proximal femur in the subtrochanteric region laterally with decreased bone density. Hip and knee alignment are grossly maintained. No soft tissue abnormality. IMPRESSION: 1. No evidence of left femur fracture. 2. Questionable cortical thinning involving the proximal femur in the subtrochanteric region laterally. Findings are suspicious for underlying bone lesion, recommend further evaluation with MRI when patient is able. Electronically Signed   By: Keith Rake M.D.   On: 05/11/2019 16:31  Scheduled Meds: . [MAR Hold] acetaminophen  1,000 mg Oral Q6H  . [MAR Hold] enoxaparin (LOVENOX) injection  40 mg Subcutaneous Q24H  . [MAR Hold] polyethylene glycol  17 g Oral Once  . [MAR Hold] senna  1 tablet Oral BID  . [MAR Hold] sodium chloride flush  3  mL Intravenous Q12H   Continuous Infusions:   LOS: 3 days    Time spent:25 mins. More than 50% of that time was spent in counseling and/or coordination of care.      Shelly Coss, MD [facility] P3/26/2021, 8:59 AM

## 2019-05-13 NOTE — Progress Notes (Signed)
PT Cancellation Note  Patient Details Name: Yesenia Sullivan MRN: [id number] DOB: [birth year]   Cancelled Treatment:    Reason Eval/Treat Not Completed: Patient at procedure or test/unavailable(sx for left femur)   [name] B [name] 05/13/2019, 7:22 AM  Bayard Males, PT [facility] Pager: [phone] Office: [phone]

## 2019-05-14 ENCOUNTER — Inpatient Hospital Stay (HOSPITAL_COMMUNITY): Payer: PPO

## 2019-05-14 LAB — CBC
HCT: 30.3 % — ABNORMAL LOW (ref 36.0–46.0)
Hemoglobin: 9.7 g/dL — ABNORMAL LOW (ref 12.0–15.0)
MCH: 29.8 pg (ref 26.0–34.0)
MCHC: 32 g/dL (ref 30.0–36.0)
MCV: 93.2 fL (ref 80.0–100.0)
Platelets: 121 10*3/uL — ABNORMAL LOW (ref 150–400)
RBC: 3.25 MIL/uL — ABNORMAL LOW (ref 3.87–5.11)
RDW: 13.1 % (ref 11.5–15.5)
WBC: 6.6 10*3/uL (ref 4.0–10.5)
nRBC: 0 % (ref 0.0–0.2)

## 2019-05-14 MED ORDER — MIDAZOLAM HCL 2 MG/2ML IJ SOLN
2.0000 mg | Freq: Once | INTRAMUSCULAR | Status: AC
  Administered 2019-05-14: 2 mg via INTRAVENOUS
  Filled 2019-05-14 (×2): qty 2

## 2019-05-14 MED ORDER — GADOBUTROL 1 MMOL/ML IV SOLN
7.3000 mL | Freq: Once | INTRAVENOUS | Status: AC | PRN
  Administered 2019-05-14: 7.3 mL via INTRAVENOUS

## 2019-05-14 NOTE — Progress Notes (Signed)
PT Cancellation Note  Patient Details Name: Yesenia Sullivan MRN: [id number] DOB: [birth year]   Cancelled Treatment:    Reason Eval/Treat Not Completed: Patient declined, no reason specified. Pt reports she has just settled into bed and had mobilized earlier in the day when visitors were present. Pt declines PT intervention at this time, is agreeable to PT return tomorrow.   Zenaida Niece 05/14/2019, 5:03 PM

## 2019-05-14 NOTE — Consult Note (Signed)
Chief Complaint: Patient was seen in consultation today for liver lesion biopsy Chief Complaint  Patient presents with   Leg Pain   Leg Swelling   at the request of Dr Annamary Rummage   Supervising Physician: Aletta Edouard  Patient Status: [facility] - In-pt  History of Present Illness: Yesenia Sullivan is a 74 y.o. female   LLE pain and swelling Came to ED Known breast mass- 1 yr ago; has follwwed with PCP and referral to [facility]- but sudden Rt leg pain while leaving MD office-- and pathologic fracture found Recent Rt femur fx-- no injury; +lytic lesion on imaging Right femur x-ray showed severely displaced and angulated distal right femoral shaft fracture. Surgery for femur just 05/11/19  Imaging upon evaluation in RD: IMPRESSION: 1. Large (95 cubic cm) right upper breast mass likely representing recurrent breast cancer. Extensive scattered metastatic lesions throughout the skeleton, with various pathologic fractures primarily involving the ribs and bilateral scapula as well as the right iliac bone. 2. Scattered masses in the liver are most compatible with metastatic disease. 3. There are a few scattered tiny pulmonary nodules which are nonspecific. 4. Contracted gallbladder containing gallstones. Mild intrahepatic and extrahepatic biliary dilatation, cause uncertain. 5. Nonobstructive left nephrolithiasis. 6. 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 7. Other imaging findings of potential clinical significance: Aortic Atherosclerosis (ICD10-I70.0). Sigmoid colon diverticulosis. Multilevel lumbar spondylosis and degenerative disc disease. Trace right pleural effusion.  Oncology has sen pt Requesting liver lesion biopsy for tissue diagnosis  Approved and planned for 05/16/19  Past Medical History:  Diagnosis Date   URI (upper respiratory infection)     Past Surgical History:  Procedure Laterality Date   ABDOMINAL HYSTERECTOMY     HIP  SURGERY     ORIF FEMUR FRACTURE Right 05/11/2019   Procedure: OPEN REDUCTION INTERNAL FIXATION (ORIF) DISTAL FEMUR FRACTURE;  Surgeon: Shona Needles, MD;  Location: [place];  Service: Orthopedics;  Laterality: Right;    Allergies: Codeine and Penicillins  Medications: Prior to Admission medications   Medication Sig Start Date End Date Taking? Authorizing Provider  acetaminophen (TYLENOL) 325 MG tablet Take 325 mg by mouth every 6 (six) hours as needed for mild pain or headache.   Yes [provider]  Cholecalciferol (VITAMIN D3 PO) Take 1 capsule by mouth daily.   Yes [provider]  ibuprofen (ADVIL) 200 MG tablet Take 200 mg by mouth every 6 (six) hours as needed for headache or moderate pain.   Yes [provider]  MAGNESIUM PO Take 1 tablet by mouth daily.   Yes [provider]  Multiple Vitamin (MULTIVITAMIN WITH MINERALS) TABS tablet Take 1 tablet by mouth daily.   Yes [provider]  predniSONE (DELTASONE) 10 MG tablet Take 2 tablets (20 mg total) by mouth daily with breakfast. Take 2 pills a day for 10 days then decrease to 1 pill for 2 days and then 1/2 pill for 2 days. 05/04/19  Yes Matilde Haymaker, MD  enoxaparin (LOVENOX) 40 MG/0.4ML injection Inject 0.4 mLs (40 mg total) into the skin daily. 05/13/19 06/12/19  Delray Alt, PA-C  methocarbamol (ROBAXIN) 500 MG tablet Take 1 tablet (500 mg total) by mouth every 6 (six) hours as needed for muscle spasms. 05/13/19   Delray Alt, PA-C  oxyCODONE (OXY IR/ROXICODONE) 5 MG immediate release tablet Take 1 tablet (5 mg total) by mouth every 4 (four) hours as needed for severe pain. 05/13/19   Delray Alt, PA-C  History reviewed. No pertinent family history.  Social History   Socioeconomic History   Marital status: Widowed    Spouse name: Not on file   Number of children: Not on file   Years of education: Not on file   Highest education level: Not on file  Occupational  History   Not on file  Tobacco Use   Smoking status: Never Smoker   Smokeless tobacco: Never Used  Substance and Sexual Activity   Alcohol use: Never   Drug use: Never   Sexual activity: Not on file  Other Topics Concern   Not on file  Social History Narrative   Not on file   Social Determinants of Health   Financial Resource Strain:    Difficulty of Paying Living Expenses:   Food Insecurity:    Worried About [place] in the Last Year:    Arboriculturist in the Last Year:   Transportation Needs:    Film/video editor (Medical):    Lack of Transportation (Non-Medical):   Physical Activity:    Days of Exercise per Week:    Minutes of Exercise per Session:   Stress:    Feeling of Stress :   Social Connections:    Frequency of Communication with Friends and Family:    Frequency of Social Gatherings with Friends and Family:    Attends Religious Services:    Active Member of Clubs or Organizations:    Attends Archivist Meetings:    Marital Status:     Review of Systems: A 12 point ROS discussed and pertinent positives are indicated in the HPI above.  All other systems are negative.  Review of Systems  Constitutional: Positive for activity change and fatigue. Negative for unexpected weight change.  Respiratory: Negative for shortness of breath.   Gastrointestinal: Positive for abdominal pain.  Musculoskeletal: Positive for gait problem.  Psychiatric/Behavioral: Negative for confusion and decreased concentration.    Vital Signs: BP 118/61 (BP Location: Right Arm)    Pulse 68    Temp 98.8 F (37.1 C) (Oral)    Resp 18    Ht 5' 2.75" (1.594 m)    Wt 163 lb (73.9 kg)    SpO2 98%    BMI 29.10 kg/m   Physical Exam Vitals reviewed.  Cardiovascular:     Rate and Rhythm: Normal rate and regular rhythm.     Heart sounds: Normal heart sounds.  Pulmonary:     Effort: Pulmonary effort is normal.     Breath sounds: Normal breath  sounds.  Abdominal:     Palpations: Abdomen is soft.  Musculoskeletal:     Comments: Resent Bilat leg surgeries  Skin:    General: Skin is warm and dry.  Neurological:     Mental Status: She is alert and oriented to person, place, and time.  Psychiatric:        Behavior: Behavior normal.        Thought Content: Thought content normal.        Judgment: Judgment normal.     Imaging: DG Chest 1 View  Result Date: 05/10/2019 CLINICAL DATA:  Preop for femur surgery. EXAM: CHEST  1 VIEW COMPARISON:  None. FINDINGS: The heart size and mediastinal contours are within normal limits. Both lungs are clear. The visualized skeletal structures are unremarkable. IMPRESSION: No active disease. Electronically Signed   By: Marijo Conception M.D.   On: 05/10/2019 15:26   CT CHEST W CONTRAST  Result Date: 05/12/2019 CLINICAL DATA:  Metastatic disease, staging and assessment for primary. EXAM: CT CHEST, ABDOMEN, AND PELVIS WITH CONTRAST TECHNIQUE: Multidetector CT imaging of the chest, abdomen and pelvis was performed following the standard protocol during bolus administration of intravenous contrast. CONTRAST:  175mL OMNIPAQUE IOHEXOL 300 MG/ML  SOLN COMPARISON:  Chest radiograph 04/12/2019 FINDINGS: CT CHEST FINDINGS Cardiovascular: Atherosclerotic calcification of the aortic arch. Mediastinum/Nodes: No pathologic thoracic adenopathy is observed. Lungs/Pleura: Likely subpleural lymph node along the minor fissure measuring 0.7 by 0.6 by 0.2 cm shown on image 70/6. 2 mm right middle lobe nodule on image 90/4. Mild dependent atelectasis in the posterior basal segment right lower lobe. Trace adjacent right pleural effusion. 3 mm right lower lobe nodule, image 97/4. 3 mm lingular nodule, image 92/4. 0.4 by 0.3 cm left lower lobe subpleural nodule, image 111/4. 0.5 by 0.4 cm left lower lobe nodule, image 103/4. Musculoskeletal: 6.4 by 4.9 by 5.8 cm (volume = 95 cm^3) enhancing mass in the right upper breast extending to  the cutaneous surface. This abuts the superficial fascia margin of the pectoralis major muscle but I do not observe definite intramuscular invasion. Scattered osseous metastatic disease. Pathologic fracture of the base of the right acromion, image 6/3. Likely pathologic fracture of the inferior tip of the left scapula with surrounding osteoid formation, image 29/3. Numerous scattered bilateral rib lesions, many with pathologic fractures. Permeative mass involving the sternal manubrium. Multilevel vertebral involvement most notable at T9, and T7. Suspected pathologic fracture of the T7 spinous process. CT ABDOMEN PELVIS FINDINGS Hepatobiliary: Indistinct scattered metastatic lesions are present throughout the liver. Index segment 2 lesion 2.2 by 2.0 cm on image 46/3. Index right hepatic lobe lesion 2.9 by 2.6 cm on image 60/3. Multiple additional scattered lesions compatible with metastatic disease observed. Contracted gallbladder containing gallstones. Mild intrahepatic and extrahepatic biliary dilatation, cause uncertain. Pancreas: Unremarkable Spleen: Unremarkable Adrenals/Urinary Tract: Two nonobstructive left kidney lower pole calculi are present, one measuring 1.1 cm in long axis in the other measuring the same. 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 The ureters and urinary bladder partially obscured by streak artifact from the patient's right hip implant. There appears to be gas anteriorly in the urinary bladder, query recent catheterization. Left mid kidney cyst posteriorly. Stomach/Bowel: Sigmoid colon diverticulosis. Scattered diverticula of the descending colon. Vascular/Lymphatic: Aortoiliac atherosclerotic vascular disease. No pathologic adenopathy identified. Reproductive: Uterus absent.  Adnexa unremarkable. Other: Small locules of gas in the subcutaneous tissues of the right lower quadrant abdomen, possibly injection related. Musculoskeletal: Right total  hip prosthesis. Permeative lesion in the right iliac crest with suspected nondisplaced pathologic fracture extending vertically in the right iliac bone. Lytic lesions in the left iliac crest and anterior superior left iliac spine. Primarily lytic lesions in the left upper sacrum. Primarily lytic lesions in the lumbar spine, including for example the right transverse process of L5. Grade 1 degenerative retrolisthesis at T11-12, T12-L1, and L1-2 with mild grade 1 anterolisthesis at L3-4 and L4-5. IMPRESSION: 1. Large (95 cubic cm) right upper breast mass likely representing recurrent breast cancer. Extensive scattered metastatic lesions throughout the skeleton, with various pathologic fractures primarily involving the ribs and bilateral scapula as well as the right iliac bone. 2. Scattered masses in the liver are most compatible with metastatic disease. 3. There are a few scattered tiny pulmonary nodules which are nonspecific. 4. Contracted gallbladder containing gallstones. Mild intrahepatic and extrahepatic biliary dilatation, cause uncertain. 5. Nonobstructive left nephrolithiasis.  6. 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 7. Other imaging findings of potential clinical significance: Aortic Atherosclerosis (ICD10-I70.0). Sigmoid colon diverticulosis. Multilevel lumbar spondylosis and degenerative disc disease. Trace right pleural effusion. Electronically Signed   By: Van Clines M.D.   On: 05/12/2019 15:35   MR BRAIN WO CONTRAST  Result Date: 05/04/2019 CLINICAL DATA:  Metastatic disease evaluation. Additional history provided by technologist: Patient reports pain in legs and arms over 2 weeks, trouble walking, breast lump, trouble walking due to leg pain. EXAM: MRI HEAD WITHOUT CONTRAST TECHNIQUE: Multiplanar, multiecho pulse sequences of the brain and surrounding structures were obtained without intravenous contrast. COMPARISON:  No pertinent prior studies available for  comparison. FINDINGS: Brain: Please note evaluation for intracranial metastatic disease is limited on this non-contrast examination. There is mild motion degradation of the axial diffusion-weighted imaging. No evidence of acute infarct. No evidence of intracranial mass. No midline shift or extra-axial fluid collection. No chronic intracranial blood products. Mild scattered T2/FLAIR hyperintensity within the cerebral white matter is nonspecific, but consistent with chronic small vessel ischemic disease. Mild generalized parenchymal atrophy. Vascular: Flow voids maintained within the proximal large arterial vessels. Skull and upper cervical spine: There is a 13 mm focus of T2 hyperintensity and corresponding diffusion weighted hyperintensity within the right frontoparietal calvarium (series 11, image 31) (series 7, image 83). There is abnormal T1 hypointense signal within portions of the upper cervical spine with relative sparing of the C4 vertebral body. Sinuses/Orbits: Visualized orbits demonstrate no acute abnormality. Mild ethmoid sinus mucosal thickening. No significant mastoid effusion. IMPRESSION: 13 mm focus of T2 and diffusion-weighted signal hyperintensity within the right frontoparietal calvarium. There is also abnormal T1 hypointense marrow signal within portions of the visualized upper cervical spine with relative sparing of the C3 vertebra. Findings are indeterminate and osseous metastatic disease cannot be excluded. Consider nonemergent postcontrast MR imaging of the head and contrast-enhanced MRI of the cervical spine for further evaluation. Within the limitations of a noncontrast study, there is no evidence of metastatic disease within the intracranial compartment. No evidence of acute intracranial abnormality. Mild generalized parenchymal atrophy and chronic small vessel ischemic disease. Electronically Signed   By: Kellie Simmering DO   On: 05/04/2019 14:58   MR BRAIN W WO CONTRAST  Result Date:  05/14/2019 CLINICAL DATA:  Rule out metastatic disease.  Right breast lump. EXAM: MRI HEAD WITHOUT AND WITH CONTRAST TECHNIQUE: Multiplanar, multiecho pulse sequences of the brain and surrounding structures were obtained without and with intravenous contrast. CONTRAST:  7.61mL GADAVIST GADOBUTROL 1 MMOL/ML IV SOLN COMPARISON:  MRI head without contrast 05/04/2019 FINDINGS: Brain: Ventricle size normal. Mild cortical atrophy. Negative for acute infarct. Small white matter hyperintensities bilaterally consistent with chronic microvascular ischemia. Negative for hemorrhage or mass. Postcontrast imaging demonstrates no enhancing metastatic deposits in the brain. Vascular: Normal arterial flow voids Skull and upper cervical spine: 12 mm enhancing lesion right frontal bone. Infiltrative bone marrow lesions at C2 and C4. These findings are compatible with skeletal metastatic disease. Sinuses/Orbits: Mild mucosal edema paranasal sinuses. Bilateral cataract extraction Other: None IMPRESSION: Negative for metastatic disease to the brain Skeletal metastatic disease right frontal bone, C2, C4 vertebral bodies. Electronically Signed   By: Franchot Gallo M.D.   On: 05/14/2019 10:32   CT ABDOMEN PELVIS W CONTRAST  Result Date: 05/12/2019 CLINICAL DATA:  Metastatic disease, staging and assessment for primary. EXAM: CT CHEST, ABDOMEN, AND PELVIS WITH CONTRAST TECHNIQUE: Multidetector CT imaging of the chest, abdomen and pelvis  was performed following the standard protocol during bolus administration of intravenous contrast. CONTRAST:  182mL OMNIPAQUE IOHEXOL 300 MG/ML  SOLN COMPARISON:  Chest radiograph 04/12/2019 FINDINGS: CT CHEST FINDINGS Cardiovascular: Atherosclerotic calcification of the aortic arch. Mediastinum/Nodes: No pathologic thoracic adenopathy is observed. Lungs/Pleura: Likely subpleural lymph node along the minor fissure measuring 0.7 by 0.6 by 0.2 cm shown on image 70/6. 2 mm right middle lobe nodule on image  90/4. Mild dependent atelectasis in the posterior basal segment right lower lobe. Trace adjacent right pleural effusion. 3 mm right lower lobe nodule, image 97/4. 3 mm lingular nodule, image 92/4. 0.4 by 0.3 cm left lower lobe subpleural nodule, image 111/4. 0.5 by 0.4 cm left lower lobe nodule, image 103/4. Musculoskeletal: 6.4 by 4.9 by 5.8 cm (volume = 95 cm^3) enhancing mass in the right upper breast extending to the cutaneous surface. This abuts the superficial fascia margin of the pectoralis major muscle but I do not observe definite intramuscular invasion. Scattered osseous metastatic disease. Pathologic fracture of the base of the right acromion, image 6/3. Likely pathologic fracture of the inferior tip of the left scapula with surrounding osteoid formation, image 29/3. Numerous scattered bilateral rib lesions, many with pathologic fractures. Permeative mass involving the sternal manubrium. Multilevel vertebral involvement most notable at T9, and T7. Suspected pathologic fracture of the T7 spinous process. CT ABDOMEN PELVIS FINDINGS Hepatobiliary: Indistinct scattered metastatic lesions are present throughout the liver. Index segment 2 lesion 2.2 by 2.0 cm on image 46/3. Index right hepatic lobe lesion 2.9 by 2.6 cm on image 60/3. Multiple additional scattered lesions compatible with metastatic disease observed. Contracted gallbladder containing gallstones. Mild intrahepatic and extrahepatic biliary dilatation, cause uncertain. Pancreas: Unremarkable Spleen: Unremarkable Adrenals/Urinary Tract: Two nonobstructive left kidney lower pole calculi are present, one measuring 1.1 cm in long axis in the other measuring the same. 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 The ureters and urinary bladder partially obscured by streak artifact from the patient's right hip implant. There appears to be gas anteriorly in the urinary bladder, query recent catheterization. Left  mid kidney cyst posteriorly. Stomach/Bowel: Sigmoid colon diverticulosis. Scattered diverticula of the descending colon. Vascular/Lymphatic: Aortoiliac atherosclerotic vascular disease. No pathologic adenopathy identified. Reproductive: Uterus absent.  Adnexa unremarkable. Other: Small locules of gas in the subcutaneous tissues of the right lower quadrant abdomen, possibly injection related. Musculoskeletal: Right total hip prosthesis. Permeative lesion in the right iliac crest with suspected nondisplaced pathologic fracture extending vertically in the right iliac bone. Lytic lesions in the left iliac crest and anterior superior left iliac spine. Primarily lytic lesions in the left upper sacrum. Primarily lytic lesions in the lumbar spine, including for example the right transverse process of L5. Grade 1 degenerative retrolisthesis at T11-12, T12-L1, and L1-2 with mild grade 1 anterolisthesis at L3-4 and L4-5. IMPRESSION: 1. Large (95 cubic cm) right upper breast mass likely representing recurrent breast cancer. Extensive scattered metastatic lesions throughout the skeleton, with various pathologic fractures primarily involving the ribs and bilateral scapula as well as the right iliac bone. 2. Scattered masses in the liver are most compatible with metastatic disease. 3. There are a few scattered tiny pulmonary nodules which are nonspecific. 4. Contracted gallbladder containing gallstones. Mild intrahepatic and extrahepatic biliary dilatation, cause uncertain. 5. Nonobstructive left nephrolithiasis. 6. 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 7. Other imaging findings of potential clinical significance: Aortic Atherosclerosis (ICD10-I70.0). Sigmoid colon diverticulosis. Multilevel lumbar spondylosis and degenerative disc disease.  Trace right pleural effusion. Electronically Signed   By: Van Clines M.D.   On: 05/12/2019 15:35   CT FEMUR LEFT W CONTRAST  Result Date:  05/12/2019 CLINICAL DATA:  Pathologic right femur fracture. Abnormal left femur x-ray EXAM: CT OF THE LOWER LEFT EXTREMITY WITH CONTRAST TECHNIQUE: Multidetector CT imaging of the lower left extremity was performed according to the standard protocol following intravenous contrast administration. COMPARISON:  Left femur x-ray 05/11/2019 CONTRAST:  129mL OMNIPAQUE IOHEXOL 300 MG/ML  SOLN FINDINGS: Bones/Joint/Cartilage At the cranial most axial image, there is partial visualization of a healing fracture involving the anterior left iliac crest (series 3, image 1). The bone at the fracture site appears lucent and irregular which could reflect an underlying osseous lesion versus changes related to fracture. Eccentrically located lytic bone lesion within the anterolateral aspect of the subtrochanteric proximal left femur with endosteal scalloping and cortical breakthrough. Lesion measures approximately 3.5 x 2.8 cm (series 7, images 65-66; series 3, images 88-96). It is difficult to accurately assess the percentage of the medullary space occupied by this lesion on CT imaging. Suspect additional lesion within the distal femoral diaphysis where there is subtle endosteal scalloping of the lateral cortex and increased density within the marrow space measuring approximately 2.5 cm (series 7, image 66). Alignment at the hip and knee are maintained without dislocation. Ligaments Suboptimally assessed by CT. Muscles and Tendons No myotendinous abnormality is seen. Soft tissues No well-defined soft tissue mass. No fluid collection or hematoma. No inguinal lymphadenopathy. Sigmoid diverticulosis. IMPRESSION: 1. Eccentrically located lytic bone lesion within the anterolateral aspect of the subtrochanteric proximal left femur with endosteal scalloping and cortical breakthrough. Lesion is at risk for pathologic fracture. 2. Suspect additional lesion within the distal femoral diaphysis where there is subtle endosteal scalloping of the  lateral cortex and increased density within the marrow space. These findings are suspicious for metastatic disease. 3. Partial visualization of a healing fracture involving the anterior left iliac crest. The bone at the fracture site appears lucent and irregular concerning for an underlying bone lesion. Electronically Signed   By: Davina Poke D.O.   On: 05/12/2019 15:22   DG Knee Right Port  Result Date: 05/11/2019 CLINICAL DATA:  Fracture, postop. EXAM: PORTABLE RIGHT KNEE - 1-2 VIEW COMPARISON:  Preoperative radiograph 05/10/2019. Concurrent femur radiograph, reported separately. FINDINGS: Lateral plate and multi screw fixation of displaced distal femoral shaft fracture. Fracture is in improved alignment compared to preoperative imaging. Proximal aspect of the hardware is included on concurrent femur exam. Recent postsurgical change includes air and edema in the soft tissues. IMPRESSION: Lateral plate and screw fixation of displaced distal femoral shaft fracture, improved alignment compared to preoperative imaging. No medial postoperative complication. Electronically Signed   By: Keith Rake M.D.   On: 05/11/2019 15:49   DG Humerus Left  Result Date: 05/11/2019 CLINICAL DATA:  Fracture. EXAM: LEFT HUMERUS - 2+ VIEW COMPARISON:  None. FINDINGS: Cortical margins of the humerus are intact. There is no evidence of fracture or other focal bone lesions. Shoulder and elbow alignment grossly maintained. Soft tissues are unremarkable. Blood pressure cuff overlies the arm and IV in the antecubital fossa. IMPRESSION: Negative radiographs of the left humerus. Electronically Signed   By: Keith Rake M.D.   On: 05/11/2019 15:50   DG Humerus Right  Result Date: 05/11/2019 CLINICAL DATA:  Fracture. EXAM: RIGHT HUMERUS - 2+ VIEW COMPARISON:  None. FINDINGS: Cortical margins of the humerus are intact. There is no evidence of fracture  or other focal bone lesions. Shoulder and elbow alignment are grossly  maintained. Soft tissues are unremarkable. IMPRESSION: Negative radiographs of the right humerus. Electronically Signed   By: Keith Rake M.D.   On: 05/11/2019 15:52   DG C-Arm 1-60 Min  Result Date: 05/13/2019 CLINICAL DATA:  Intramedullary nail EXAM: LEFT FEMUR 2 VIEWS; DG C-ARM 1-60 MIN COMPARISON:  Radiography from 2 days ago FINDINGS: Multiple fluoroscopic images shows nail fixation of the left femur. By CT, a bone lesion is present proximally in the femur, not depicted on this study. IMPRESSION: Fluoroscopy for femoral fixation. Electronically Signed   By: Monte Fantasia M.D.   On: 05/13/2019 09:30   DG C-Arm 1-60 Min  Result Date: 05/11/2019 CLINICAL DATA:  ORIF of right femur. FLUOROSCOPY TIME:  1 minutes 58 seconds. Images: 10 EXAM: RIGHT FEMUR 2 VIEWS COMPARISON:  None. FINDINGS: A displaced distal femoral diaphysis fracture seen at the beginning of the study. Throughout the study, the fracture was reduced and a sideplate was affixed to the distal femur, crossing the fracture. The patient is status post right hip replacement as well. Only the distal femoral stem is visualized of the right hip replacement. IMPRESSION: Right distal femoral fracture repair as above. Electronically Signed   By: Dorise Bullion III M.D   On: 05/11/2019 16:01   DG HIP UNILAT WITH PELVIS 2-3 VIEWS RIGHT  Result Date: 05/10/2019 CLINICAL DATA:  Right leg injury. EXAM: DG HIP (WITH OR WITHOUT PELVIS) 2-3V RIGHT COMPARISON:  None. FINDINGS: Status post right total hip arthroplasty. The right acetabular and femoral components appear to be well situated. No fracture or dislocation is seen involving the visualized skeleton. IMPRESSION: No acute abnormality seen in the right hip. Status post right total hip arthroplasty. Electronically Signed   By: Marijo Conception M.D.   On: 05/10/2019 15:26   DG FEMUR MIN 2 VIEWS LEFT  Result Date: 05/13/2019 CLINICAL DATA:  Intramedullary nail EXAM: LEFT FEMUR 2 VIEWS; DG C-ARM  1-60 MIN COMPARISON:  Radiography from 2 days ago FINDINGS: Multiple fluoroscopic images shows nail fixation of the left femur. By CT, a bone lesion is present proximally in the femur, not depicted on this study. IMPRESSION: Fluoroscopy for femoral fixation. Electronically Signed   By: Monte Fantasia M.D.   On: 05/13/2019 09:30   DG FEMUR, MIN 2 VIEWS RIGHT  Result Date: 05/11/2019 CLINICAL DATA:  ORIF of right femur. FLUOROSCOPY TIME:  1 minutes 58 seconds. Images: 10 EXAM: RIGHT FEMUR 2 VIEWS COMPARISON:  None. FINDINGS: A displaced distal femoral diaphysis fracture seen at the beginning of the study. Throughout the study, the fracture was reduced and a sideplate was affixed to the distal femur, crossing the fracture. The patient is status post right hip replacement as well. Only the distal femoral stem is visualized of the right hip replacement. IMPRESSION: Right distal femoral fracture repair as above. Electronically Signed   By: Dorise Bullion III M.D   On: 05/11/2019 15:59   DG FEMUR, MIN 2 VIEWS RIGHT  Result Date: 05/11/2019 CLINICAL DATA:  Right femur fracture, postop. EXAM: RIGHT FEMUR 2 VIEWS COMPARISON:  Prior operative radiograph yesterday. FINDINGS: Lateral plate and multi screw fixation of distal femoral shaft fracture. Fracture is in improved alignment compared to preoperative imaging. Right hip arthroplasty appears intact. Recent postsurgical change includes air and edema in the soft tissues. IMPRESSION: Post ORIF distal right femur fracture in improved alignment compared to preoperative imaging. No immediate postoperative complications. Electronically Signed  By: Keith Rake M.D.   On: 05/11/2019 15:54   DG FEMUR, MIN 2 VIEWS RIGHT  Result Date: 05/10/2019 CLINICAL DATA:  Right leg injury. EXAM: RIGHT FEMUR 2 VIEWS COMPARISON:  None. FINDINGS: Severely displaced and angulated fracture is seen involving the distal right femoral shaft. Status post right total hip arthroplasty. No  soft tissue abnormality is noted. IMPRESSION: Severely displaced and angulated distal right femoral shaft fracture. Electronically Signed   By: Marijo Conception M.D.   On: 05/10/2019 15:24   DG FEMUR PORT MIN 2 VIEWS LEFT  Result Date: 05/13/2019 CLINICAL DATA:  Femoral nail fixation EXAM: LEFT FEMUR PORTABLE 2 VIEWS COMPARISON:  Fluoroscopy earlier today FINDINGS: Intramedullary femoral nail in expected location, as are interlocking screws proximally and distally. No complicating fracture. IMPRESSION: No unexpected finding after femoral fixation. Electronically Signed   By: Monte Fantasia M.D.   On: 05/13/2019 10:41   DG FEMUR PORT MIN 2 VIEWS LEFT  Result Date: 05/11/2019 CLINICAL DATA:  Fracture. Recent right femur fracture post surgical fixation. EXAM: LEFT FEMUR PORTABLE 2 VIEWS COMPARISON:  None. FINDINGS: No evidence of left femur fracture. There is questionable cortical thinning involving the proximal femur in the subtrochanteric region laterally with decreased bone density. Hip and knee alignment are grossly maintained. No soft tissue abnormality. IMPRESSION: 1. No evidence of left femur fracture. 2. Questionable cortical thinning involving the proximal femur in the subtrochanteric region laterally. Findings are suspicious for underlying bone lesion, recommend further evaluation with MRI when patient is able. Electronically Signed   By: Keith Rake M.D.   On: 05/11/2019 16:31    Labs:  CBC: Recent Labs    05/11/19 0333 05/12/19 0433 05/13/19 0406 05/14/19 0418  WBC 8.8 8.7 7.9 6.6  HGB 12.1 10.6* 9.8* 9.7*  HCT 37.2 32.1* 30.8* 30.3*  PLT 159 150 139* 121*    COAGS: No results for input(s): INR, APTT in the last 8760 hours.  BMP: Recent Labs    05/10/19 1530 05/11/19 0333 05/12/19 0433 05/13/19 0406  NA 139 139 138 139  K 3.9 4.0 5.0 4.1  CL 102 104 101 101  CO2 25 26 27 29   GLUCOSE 130* 91 108* 91  BUN 27* 31* 20 20  CALCIUM 9.1 9.0 8.6* 8.9  CREATININE 0.74  0.96 0.90 1.06*  GFRNONAA >60 58* >60 52*  GFRAA >60 >60 >60 60*    LIVER FUNCTION TESTS: Recent Labs    05/04/19 1223 05/10/19 1530 05/12/19 0939  BILITOT 0.6 0.6 0.5  AST 47* 51* 93*  ALT 15 20 68*  ALKPHOS 153* 168* 161*  PROT 7.6 7.6 6.3*  ALBUMIN 4.1 4.4 3.3*    TUMOR MARKERS: No results for input(s): AFPTM, CEA, CA199, CHROMGRNA in the last 8760 hours.  Assessment and Plan:  Pathologic fxs Both femurs Breast mass Liver lesion Need tissue diagnosis per Oncology Scheduled for liver lesion bx Mon 3/29 in IR Risks and benefits of liver lesion biopsy was discussed with the patient and/or patient's family including, but not limited to bleeding, infection, damage to adjacent structures or low yield requiring additional tests.  All of the questions were answered and there is agreement to proceed. Consent signed and in chart.   Thank you for this interesting consult.  I greatly enjoyed meeting Brandt Portz and look forward to participating in their care.  A copy of this report was sent to the requesting provider on this date.  Electronically Signed: Lavonia Drafts, PA-C 05/14/2019, 11:59 AM  I spent a total of 40 Minutes    in face to face in clinical consultation, greater than 50% of which was counseling/coordinating care for liver lesion bx

## 2019-05-14 NOTE — Progress Notes (Signed)
Pt currently off the unit and at MRI.

## 2019-05-14 NOTE — Plan of Care (Signed)
  Problem: Pain Managment: Goal: General experience of comfort will improve Outcome: Progressing   

## 2019-05-14 NOTE — Progress Notes (Signed)
OT Cancellation Note  Patient Details Name: Yesenia Sullivan MRN: [id number] DOB: 1945-04-21   Cancelled Treatment:    Reason Eval/Treat Not Completed: Fatigue/lethargy limiting ability to participate;Pain limiting ability to participate.  Will reattempt.  [name], OTR/L Acute Rehabilitation Services Pager [phone] Office [phone]   [name] M 05/14/2019, 1:39 PM

## 2019-05-14 NOTE — Plan of Care (Signed)

## 2019-05-14 NOTE — Progress Notes (Signed)
Subjective:   Patient is alert, oriented, sitting comfortably in bed. Patient states she is warn out from getting to toilet and back. She states bilateral leg pain is 7/10 today. Denies chest pain, shortness of breath, or calf pain. Denies nausea and vomiting. No other complaints.    Objective:  PE: VITALS:   Vitals:   05/13/19 1500 05/13/19 2015 05/14/19 0240 05/14/19 0737  BP: (!) 146/83 114/87 122/70 118/61  Pulse: 80 79 68 68  Resp: 20 18 16 18   Temp: 98.7 F (37.1 C) 98.7 F (37.1 C) 98.4 F (36.9 C) 98.8 F (37.1 C)  TempSrc: Oral Oral Oral Oral  SpO2: 95% 96% 99% 98%  Weight:      Height:       Gen: Alert and oriented, sitting comfortably in bed Resp: Unlabored, no use of accessory musculature MSK: RLE - Surgical dressings intact with no drainage. EHL and FHL intact. Able to move all toes of right foot without pain. 2+ DP pulse. Distal sensation intact. Compartments compressible.  LLE: Surgical dressings intact with no drainage. EHL and FHL intact. Able to move all toes without pain. 2+ DP pulse. Distal sensation intact.   LABS  Results for orders placed or performed during the hospital encounter of 05/10/19 (from the past 24 hour(s))  CBC     Status: Abnormal   Collection Time: 05/14/19  4:18 AM  Result Value Ref Range   WBC 6.6 4.0 - 10.5 K/uL   RBC 3.25 (L) 3.87 - 5.11 MIL/uL   Hemoglobin 9.7 (L) 12.0 - 15.0 g/dL   HCT 30.3 (L) 36.0 - 46.0 %   MCV 93.2 80.0 - 100.0 fL   MCH 29.8 26.0 - 34.0 pg   MCHC 32.0 30.0 - 36.0 g/dL   RDW 13.1 11.5 - 15.5 %   Platelets 121 (L) 150 - 400 K/uL   nRBC 0.0 0.0 - 0.2 %    CT CHEST W CONTRAST  Result Date: 05/12/2019 CLINICAL DATA:  Metastatic disease, staging and assessment for primary. EXAM: CT CHEST, ABDOMEN, AND PELVIS WITH CONTRAST TECHNIQUE: Multidetector CT imaging of the chest, abdomen and pelvis was performed following the standard protocol during bolus administration of intravenous contrast. CONTRAST:   142mL OMNIPAQUE IOHEXOL 300 MG/ML  SOLN COMPARISON:  Chest radiograph 04/12/2019 FINDINGS: CT CHEST FINDINGS Cardiovascular: Atherosclerotic calcification of the aortic arch. Mediastinum/Nodes: No pathologic thoracic adenopathy is observed. Lungs/Pleura: Likely subpleural lymph node along the minor fissure measuring 0.7 by 0.6 by 0.2 cm shown on image 70/6. 2 mm right middle lobe nodule on image 90/4. Mild dependent atelectasis in the posterior basal segment right lower lobe. Trace adjacent right pleural effusion. 3 mm right lower lobe nodule, image 97/4. 3 mm lingular nodule, image 92/4. 0.4 by 0.3 cm left lower lobe subpleural nodule, image 111/4. 0.5 by 0.4 cm left lower lobe nodule, image 103/4. Musculoskeletal: 6.4 by 4.9 by 5.8 cm (volume = 95 cm^3) enhancing mass in the right upper breast extending to the cutaneous surface. This abuts the superficial fascia margin of the pectoralis major muscle but I do not observe definite intramuscular invasion. Scattered osseous metastatic disease. Pathologic fracture of the base of the right acromion, image 6/3. Likely pathologic fracture of the inferior tip of the left scapula with surrounding osteoid formation, image 29/3. Numerous scattered bilateral rib lesions, many with pathologic fractures. Permeative mass involving the sternal manubrium. Multilevel vertebral involvement most notable at T9, and T7. Suspected pathologic fracture of the T7 spinous process.  CT ABDOMEN PELVIS FINDINGS Hepatobiliary: Indistinct scattered metastatic lesions are present throughout the liver. Index segment 2 lesion 2.2 by 2.0 cm on image 46/3. Index right hepatic lobe lesion 2.9 by 2.6 cm on image 60/3. Multiple additional scattered lesions compatible with metastatic disease observed. Contracted gallbladder containing gallstones. Mild intrahepatic and extrahepatic biliary dilatation, cause uncertain. Pancreas: Unremarkable Spleen: Unremarkable Adrenals/Urinary Tract: Two nonobstructive  left kidney lower pole calculi are present, one measuring 1.1 cm in long axis in the other measuring the same. 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 The ureters and urinary bladder partially obscured by streak artifact from the patient's right hip implant. There appears to be gas anteriorly in the urinary bladder, query recent catheterization. Left mid kidney cyst posteriorly. Stomach/Bowel: Sigmoid colon diverticulosis. Scattered diverticula of the descending colon. Vascular/Lymphatic: Aortoiliac atherosclerotic vascular disease. No pathologic adenopathy identified. Reproductive: Uterus absent.  Adnexa unremarkable. Other: Small locules of gas in the subcutaneous tissues of the right lower quadrant abdomen, possibly injection related. Musculoskeletal: Right total hip prosthesis. Permeative lesion in the right iliac crest with suspected nondisplaced pathologic fracture extending vertically in the right iliac bone. Lytic lesions in the left iliac crest and anterior superior left iliac spine. Primarily lytic lesions in the left upper sacrum. Primarily lytic lesions in the lumbar spine, including for example the right transverse process of L5. Grade 1 degenerative retrolisthesis at T11-12, T12-L1, and L1-2 with mild grade 1 anterolisthesis at L3-4 and L4-5. IMPRESSION: 1. Large (95 cubic cm) right upper breast mass likely representing recurrent breast cancer. Extensive scattered metastatic lesions throughout the skeleton, with various pathologic fractures primarily involving the ribs and bilateral scapula as well as the right iliac bone. 2. Scattered masses in the liver are most compatible with metastatic disease. 3. There are a few scattered tiny pulmonary nodules which are nonspecific. 4. Contracted gallbladder containing gallstones. Mild intrahepatic and extrahepatic biliary dilatation, cause uncertain. 5. Nonobstructive left nephrolithiasis. 6. 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 7. Other imaging findings of potential clinical significance: Aortic Atherosclerosis (ICD10-I70.0). Sigmoid colon diverticulosis. Multilevel lumbar spondylosis and degenerative disc disease. Trace right pleural effusion. Electronically Signed   By: Van Clines M.D.   On: 05/12/2019 15:35   MR BRAIN W WO CONTRAST  Result Date: 05/14/2019 CLINICAL DATA:  Rule out metastatic disease.  Right breast lump. EXAM: MRI HEAD WITHOUT AND WITH CONTRAST TECHNIQUE: Multiplanar, multiecho pulse sequences of the brain and surrounding structures were obtained without and with intravenous contrast. CONTRAST:  7.31mL GADAVIST GADOBUTROL 1 MMOL/ML IV SOLN COMPARISON:  MRI head without contrast 05/04/2019 FINDINGS: Brain: Ventricle size normal. Mild cortical atrophy. Negative for acute infarct. Small white matter hyperintensities bilaterally consistent with chronic microvascular ischemia. Negative for hemorrhage or mass. Postcontrast imaging demonstrates no enhancing metastatic deposits in the brain. Vascular: Normal arterial flow voids Skull and upper cervical spine: 12 mm enhancing lesion right frontal bone. Infiltrative bone marrow lesions at C2 and C4. These findings are compatible with skeletal metastatic disease. Sinuses/Orbits: Mild mucosal edema paranasal sinuses. Bilateral cataract extraction Other: None IMPRESSION: Negative for metastatic disease to the brain Skeletal metastatic disease right frontal bone, C2, C4 vertebral bodies. Electronically Signed   By: Franchot Gallo M.D.   On: 05/14/2019 10:32   CT ABDOMEN PELVIS W CONTRAST  Result Date: 05/12/2019 CLINICAL DATA:  Metastatic disease, staging and assessment for primary. EXAM: CT CHEST, ABDOMEN, AND PELVIS WITH CONTRAST TECHNIQUE: Multidetector CT imaging of the chest,  abdomen and pelvis was performed following the standard protocol during bolus administration of intravenous contrast. CONTRAST:  17mL OMNIPAQUE  IOHEXOL 300 MG/ML  SOLN COMPARISON:  Chest radiograph 04/12/2019 FINDINGS: CT CHEST FINDINGS Cardiovascular: Atherosclerotic calcification of the aortic arch. Mediastinum/Nodes: No pathologic thoracic adenopathy is observed. Lungs/Pleura: Likely subpleural lymph node along the minor fissure measuring 0.7 by 0.6 by 0.2 cm shown on image 70/6. 2 mm right middle lobe nodule on image 90/4. Mild dependent atelectasis in the posterior basal segment right lower lobe. Trace adjacent right pleural effusion. 3 mm right lower lobe nodule, image 97/4. 3 mm lingular nodule, image 92/4. 0.4 by 0.3 cm left lower lobe subpleural nodule, image 111/4. 0.5 by 0.4 cm left lower lobe nodule, image 103/4. Musculoskeletal: 6.4 by 4.9 by 5.8 cm (volume = 95 cm^3) enhancing mass in the right upper breast extending to the cutaneous surface. This abuts the superficial fascia margin of the pectoralis major muscle but I do not observe definite intramuscular invasion. Scattered osseous metastatic disease. Pathologic fracture of the base of the right acromion, image 6/3. Likely pathologic fracture of the inferior tip of the left scapula with surrounding osteoid formation, image 29/3. Numerous scattered bilateral rib lesions, many with pathologic fractures. Permeative mass involving the sternal manubrium. Multilevel vertebral involvement most notable at T9, and T7. Suspected pathologic fracture of the T7 spinous process. CT ABDOMEN PELVIS FINDINGS Hepatobiliary: Indistinct scattered metastatic lesions are present throughout the liver. Index segment 2 lesion 2.2 by 2.0 cm on image 46/3. Index right hepatic lobe lesion 2.9 by 2.6 cm on image 60/3. Multiple additional scattered lesions compatible with metastatic disease observed. Contracted gallbladder containing gallstones. Mild intrahepatic and extrahepatic biliary dilatation, cause uncertain. Pancreas: Unremarkable Spleen: Unremarkable Adrenals/Urinary Tract: Two nonobstructive left kidney lower  pole calculi are present, one measuring 1.1 cm in long axis in the other measuring the same. 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 The ureters and urinary bladder partially obscured by streak artifact from the patient's right hip implant. There appears to be gas anteriorly in the urinary bladder, query recent catheterization. Left mid kidney cyst posteriorly. Stomach/Bowel: Sigmoid colon diverticulosis. Scattered diverticula of the descending colon. Vascular/Lymphatic: Aortoiliac atherosclerotic vascular disease. No pathologic adenopathy identified. Reproductive: Uterus absent.  Adnexa unremarkable. Other: Small locules of gas in the subcutaneous tissues of the right lower quadrant abdomen, possibly injection related. Musculoskeletal: Right total hip prosthesis. Permeative lesion in the right iliac crest with suspected nondisplaced pathologic fracture extending vertically in the right iliac bone. Lytic lesions in the left iliac crest and anterior superior left iliac spine. Primarily lytic lesions in the left upper sacrum. Primarily lytic lesions in the lumbar spine, including for example the right transverse process of L5. Grade 1 degenerative retrolisthesis at T11-12, T12-L1, and L1-2 with mild grade 1 anterolisthesis at L3-4 and L4-5. IMPRESSION: 1. Large (95 cubic cm) right upper breast mass likely representing recurrent breast cancer. Extensive scattered metastatic lesions throughout the skeleton, with various pathologic fractures primarily involving the ribs and bilateral scapula as well as the right iliac bone. 2. Scattered masses in the liver are most compatible with metastatic disease. 3. There are a few scattered tiny pulmonary nodules which are nonspecific. 4. Contracted gallbladder containing gallstones. Mild intrahepatic and extrahepatic biliary dilatation, cause uncertain. 5. Nonobstructive left nephrolithiasis. 6. 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 7. Other imaging findings of potential clinical significance: Aortic Atherosclerosis (ICD10-I70.0). Sigmoid colon diverticulosis. Multilevel lumbar spondylosis  and degenerative disc disease. Trace right pleural effusion. Electronically Signed   By: Van Clines M.D.   On: 05/12/2019 15:35   CT FEMUR LEFT W CONTRAST  Result Date: 05/12/2019 CLINICAL DATA:  Pathologic right femur fracture. Abnormal left femur x-ray EXAM: CT OF THE LOWER LEFT EXTREMITY WITH CONTRAST TECHNIQUE: Multidetector CT imaging of the lower left extremity was performed according to the standard protocol following intravenous contrast administration. COMPARISON:  Left femur x-ray 05/11/2019 CONTRAST:  125mL OMNIPAQUE IOHEXOL 300 MG/ML  SOLN FINDINGS: Bones/Joint/Cartilage At the cranial most axial image, there is partial visualization of a healing fracture involving the anterior left iliac crest (series 3, image 1). The bone at the fracture site appears lucent and irregular which could reflect an underlying osseous lesion versus changes related to fracture. Eccentrically located lytic bone lesion within the anterolateral aspect of the subtrochanteric proximal left femur with endosteal scalloping and cortical breakthrough. Lesion measures approximately 3.5 x 2.8 cm (series 7, images 65-66; series 3, images 88-96). It is difficult to accurately assess the percentage of the medullary space occupied by this lesion on CT imaging. Suspect additional lesion within the distal femoral diaphysis where there is subtle endosteal scalloping of the lateral cortex and increased density within the marrow space measuring approximately 2.5 cm (series 7, image 66). Alignment at the hip and knee are maintained without dislocation. Ligaments Suboptimally assessed by CT. Muscles and Tendons No myotendinous abnormality is seen. Soft tissues No well-defined soft tissue mass. No fluid collection or hematoma. No inguinal  lymphadenopathy. Sigmoid diverticulosis. IMPRESSION: 1. Eccentrically located lytic bone lesion within the anterolateral aspect of the subtrochanteric proximal left femur with endosteal scalloping and cortical breakthrough. Lesion is at risk for pathologic fracture. 2. Suspect additional lesion within the distal femoral diaphysis where there is subtle endosteal scalloping of the lateral cortex and increased density within the marrow space. These findings are suspicious for metastatic disease. 3. Partial visualization of a healing fracture involving the anterior left iliac crest. The bone at the fracture site appears lucent and irregular concerning for an underlying bone lesion. Electronically Signed   By: Davina Poke D.O.   On: 05/12/2019 15:22   DG C-Arm 1-60 Min  Result Date: 05/13/2019 CLINICAL DATA:  Intramedullary nail EXAM: LEFT FEMUR 2 VIEWS; DG C-ARM 1-60 MIN COMPARISON:  Radiography from 2 days ago FINDINGS: Multiple fluoroscopic images shows nail fixation of the left femur. By CT, a bone lesion is present proximally in the femur, not depicted on this study. IMPRESSION: Fluoroscopy for femoral fixation. Electronically Signed   By: Monte Fantasia M.D.   On: 05/13/2019 09:30   DG FEMUR MIN 2 VIEWS LEFT  Result Date: 05/13/2019 CLINICAL DATA:  Intramedullary nail EXAM: LEFT FEMUR 2 VIEWS; DG C-ARM 1-60 MIN COMPARISON:  Radiography from 2 days ago FINDINGS: Multiple fluoroscopic images shows nail fixation of the left femur. By CT, a bone lesion is present proximally in the femur, not depicted on this study. IMPRESSION: Fluoroscopy for femoral fixation. Electronically Signed   By: Monte Fantasia M.D.   On: 05/13/2019 09:30   DG FEMUR PORT MIN 2 VIEWS LEFT  Result Date: 05/13/2019 CLINICAL DATA:  Femoral nail fixation EXAM: LEFT FEMUR PORTABLE 2 VIEWS COMPARISON:  Fluoroscopy earlier today FINDINGS: Intramedullary femoral nail in expected location, as are interlocking screws proximally and  distally. No complicating fracture. IMPRESSION: No unexpected finding after femoral fixation. Electronically Signed   By: Monte Fantasia M.D.   On: 05/13/2019 10:41    Assessment/Plan:  Right pathologic femur fracture Left pathologic lesion of femur: 3/24 - ORIF R femur fracture w/ bone biopsy of right femur 3/26- prophylactic IMN L femur - Weightbearing: TDWB RLE, WBAT LLE - Insicional and dressing care: PRN dressing changes - VTE prophylaxis: Lovenox restarted Hbg stable at 9.7 this morning, SCD's - Pain control: continue current regimen 1. Tylenol 1000 mg q 6 hours scheduled 2. Robaxin 500 mg q 6 hours PRN 3. Oxycodone 5-10 mg q 4 hours PRN 4. Dilaudid 1 mg q 3 hours PRN  Dispo: PT/OT evaluation for HH vs SNF recommendations  Follow - up plan: Follow up with Dr. Doreatha Martin in 2 weeks. Will need breast biopsy and subsequent oncology eval as outpatient. Patient okay for discharge from orthopedic standpoint when cleared by therapy and medical team.  Contact information:   Weekdays 8-5 Merlene Pulling, PA-C [phone] A fter hours and holidays please check [email or web address] for group call information for [facility]  Ventura Bruns 05/14/2019, 10:56 AM

## 2019-05-14 NOTE — Progress Notes (Signed)
Pt arrived back to the unit.

## 2019-05-14 NOTE — Progress Notes (Signed)
PROGRESS NOTE    Yesenia Sullivan  [id number] DOB: 1945/08/24 DOA: 05/10/2019 PCP: Patient, No Pcp Per   Brief Narrative:  Patient is a 74 female with no significant past medical history who presented to the emergency department on 3/23 with complaints of left lower extremity pain, swelling.   She also reported noticing of right breast lump about a year ago. On 3/17, she had presented to the emergency department, at the time MRI of the brain showed findings suspicious for osseous metastatic lesion. She wanted to be discharged and follow as an outpatient She has been seen by her physician on 3/23 and has been referred to breast clinic for further evaluation. On the way out of her PCPs office, she suddenly felt a pop on her right thigh followed by severe pain and obvious deformity.  X-ray done in the emergency room showed right femur fracture which was severely displaced and angulated. CT scan of the left femur showed lytic lesion in the peritrochanteric region with more than one third of the femur involved. Orthopedics consulted.  Underwent nailing of left femur on 05/13/2019.  She is waiting for PT/OT evaluation Initiated breast cancer work-up.  Assessment & Plan:   Active Problems:   Closed femur fracture (HCC)   Right closed femur fracture: Likely pathological fracture. Imaging findings as above.Underwent  nailing of the left femur. Continue pain management.  PT/OT evaluation .  Elevated blood pressure: Most likely secondary to pain. Continue to monitor blood pressure. Continue as needed medication.No H/O HTN  Right breast mass with evidence of  metastasis: She had felt right breast lump a year ago but did not want to seek medical attention. She has been followed by her PCP and has been referred to breast clinic. MRI of the brain done on 3/17 due to pain on extremities, trouble walking. It showed 13 mm focus of T2 and diffusion-weighted signal hyperintensity in the right frontoparietal  area, suspicious for metastatic disease. Case has been discussed with Dr.Zhao, oncology.CT chest/abdomen/pelvis with contrast done here showed large left-sided breast mass, diffuse metastatic skeletal lesions with pathological fractures of the ribs, hepatic metastasis. Plan for liver biopsy for diagnosis on Monday MRI of the brain done on this admission showed no metastatic lesions but showed skeletal metastatic disease right frontal bone, C2, C4 vertebral bodies.  Constipation: Continue bowel regimen         DVT prophylaxis:Lovenox Code Status: Full code Family Communication: Patient understands the plan.  She does not think we need to call anybody for the update. Disposition Plan: Patient is from home. Likely needs rehab/nursing facility with femur fracture. Waiting for PT/OT evaluation.  Also waiting for cancer work-up   Consultants: Orthopedics, oncology  Procedures: Nailing of right femur  Antimicrobials:  Anti-infectives (From admission, onward)   Start     Dose/Rate Route Frequency Ordered Stop   05/13/19 2000  ceFAZolin (ANCEF) IVPB 2g/100 mL premix     2 g 200 mL/hr over 30 Minutes Intravenous Every 12 hours 05/13/19 1117 05/14/19 1959   05/11/19 2000  ceFAZolin (ANCEF) IVPB 2g/100 mL premix     2 g 200 mL/hr over 30 Minutes Intravenous Every 8 hours 05/11/19 1555 05/12/19 1300   05/11/19 1345  vancomycin (VANCOCIN) powder  Status:  Discontinued       As needed 05/11/19 1346 05/11/19 1419   05/11/19 1159  ceFAZolin (ANCEF) 2-4 GM/100ML-% IVPB    Note to Pharmacy: [name]   : cabinet override      05/11/19  1159 05/11/19 2206      Subjective: Patient seen and examined at the bedside this morning.  Hemodynamically stable.  Comfortable.  Pain well controlled.  Denies any new complaints today.  Objective: Vitals:   05/13/19 1500 05/13/19 2015 05/14/19 0240 05/14/19 0737  BP: (!) 146/83 114/87 122/70 118/61  Pulse: 80 79 68 68  Resp: 20 18 16 18   Temp: 98.7 F  (37.1 C) 98.7 F (37.1 C) 98.4 F (36.9 C) 98.8 F (37.1 C)  TempSrc: Oral Oral Oral Oral  SpO2: 95% 96% 99% 98%  Weight:      Height:        Intake/Output Summary (Last 24 hours) at 05/14/2019 [id number] Last data filed at 05/14/2019 [id number] Gross per 24 hour  Intake 1860 ml  Output 700 ml  Net 1160 ml   Filed Weights   05/10/19 1416 05/11/19 1115  Weight: 74.4 kg 73.9 kg    Examination:  General exam: Not in any kind of distress Respiratory system: Bilateral equal air entry, normal vesicular breath sounds, no wheezes or crackles  Cardiovascular system: S1 & S2 heard, RRR. No JVD, murmurs, rubs, gallops or clicks. Large hard breast mass on the right side with erythematous change/ulceration of the overlying skin Gastrointestinal system: Abdomen is nondistended, soft and nontender. No organomegaly or masses felt. Normal bowel sounds heard. Central nervous system: Alert and oriented. No focal neurological deficits. Extremities: No edema, right lower extremity wrapped with dressings Skin: No rashes, lesions or ulcers,no icterus ,no pallor MSK: Normal muscle bulk,tone ,power    Data Reviewed: I have personally reviewed following labs and imaging studies  CBC: Recent Labs  Lab 05/10/19 1530 05/11/19 0333 05/12/19 0433 05/13/19 0406 05/14/19 0418  WBC 11.2* 8.8 8.7 7.9 6.6  NEUTROABS 10.1*  --   --   --   --   HGB 12.6 12.1 10.6* 9.8* 9.7*  HCT 38.2 37.2 32.1* 30.8* 30.3*  MCV 90.7 92.5 91.7 93.1 93.2  PLT 180 159 150 139* [id number]*   Basic Metabolic Panel: Recent Labs  Lab 05/10/19 1530 05/11/19 0333 05/12/19 0433 05/13/19 0406  NA 139 139 138 139  K 3.9 4.0 5.0 4.1  CL 102 104 101 101  CO2 25 26 27 29   GLUCOSE 130* 91 108* 91  BUN 27* 31* 20 20  CREATININE 0.74 0.96 0.90 1.06*  CALCIUM 9.1 9.0 8.6* 8.9   GFR: Estimated Creatinine Clearance: 44.5 mL/min (A) (by C-G formula based on SCr of 1.06 mg/dL (H)). Liver Function Tests: Recent Labs  Lab 05/10/19 1530  05/12/19 0939  AST 51* 93*  ALT 20 68*  ALKPHOS 168* 161*  BILITOT 0.6 0.5  PROT 7.6 6.3*  ALBUMIN 4.4 3.3*   No results for input(s): LIPASE, AMYLASE in the last 168 hours. No results for input(s): AMMONIA in the last 168 hours. Coagulation Profile: No results for input(s): INR, PROTIME in the last 168 hours. Cardiac Enzymes: No results for input(s): CKTOTAL, CKMB, CKMBINDEX, TROPONINI in the last 168 hours. BNP (last 3 results) No results for input(s): PROBNP in the last 8760 hours. HbA1C: No results for input(s): HGBA1C in the last 72 hours. CBG: No results for input(s): GLUCAP in the last 168 hours. Lipid Profile: No results for input(s): CHOL, HDL, LDLCALC, TRIG, CHOLHDL, LDLDIRECT in the last 72 hours. Thyroid Function Tests: No results for input(s): TSH, T4TOTAL, FREET4, T3FREE, THYROIDAB in the last 72 hours. Anemia Panel: No results for input(s): VITAMINB12, FOLATE, FERRITIN, TIBC, IRON, RETICCTPCT in the last  72 hours. Sepsis Labs: No results for input(s): PROCALCITON, LATICACIDVEN in the last 168 hours.  Recent Results (from the past 240 hour(s))  SARS CORONAVIRUS 2 (TAT 6-24 HRS) Nasopharyngeal Nasopharyngeal Swab     Status: None   Collection Time: 05/04/19  3:53 PM   Specimen: Nasopharyngeal Swab  Result Value Ref Range Status   SARS Coronavirus 2 NEGATIVE NEGATIVE Final    Comment: (NOTE) SARS-CoV-2 target nucleic acids are NOT DETECTED. The SARS-CoV-2 RNA is generally detectable in upper and lower respiratory specimens during the acute phase of infection. Negative results do not preclude SARS-CoV-2 infection, do not rule out co-infections with other pathogens, and should not be used as the sole basis for treatment or other patient management decisions. Negative results must be combined with clinical observations, patient history, and epidemiological information. The expected result is Negative. Fact Sheet for  Patients: SugarRoll.be Fact Sheet for Healthcare Providers: https://www.woods-mathews.com/ This test is not yet approved or cleared by the [place] FDA and  has been authorized for detection and/or diagnosis of SARS-CoV-2 by FDA under an Emergency Use Authorization (EUA). This EUA will remain  in effect (meaning this test can be used) for the duration of the COVID-19 declaration under Section 56 4(b)(1) of the Act, 21 U.S.C. section 360bbb-3(b)(1), unless the authorization is terminated or revoked sooner. Performed at [facility], [place] [address], [place], [place] [address]   Respiratory Panel by RT PCR (Flu A&B, Covid) - Nasopharyngeal Swab     Status: None   Collection Time: 05/10/19  4:46 PM   Specimen: Nasopharyngeal Swab  Result Value Ref Range Status   SARS Coronavirus 2 by RT PCR NEGATIVE NEGATIVE Final    Comment: (NOTE) SARS-CoV-2 target nucleic acids are NOT DETECTED. The SARS-CoV-2 RNA is generally detectable in upper respiratoy specimens during the acute phase of infection. The lowest concentration of SARS-CoV-2 viral copies this assay can detect is 131 copies/mL. A negative result does not preclude SARS-Cov-2 infection and should not be used as the sole basis for treatment or other patient management decisions. A negative result may occur with  improper specimen collection/handling, submission of specimen other than nasopharyngeal swab, presence of viral mutation(s) within the areas targeted by this assay, and inadequate number of viral copies (<131 copies/mL). A negative result must be combined with clinical observations, patient history, and epidemiological information. The expected result is Negative. Fact Sheet for Patients:  PinkCheek.be Fact Sheet for Healthcare Providers:  GravelBags.it This test is not yet ap proved or cleared by the [place] FDA  and  has been authorized for detection and/or diagnosis of SARS-CoV-2 by FDA under an Emergency Use Authorization (EUA). This EUA will remain  in effect (meaning this test can be used) for the duration of the COVID-19 declaration under Section 564(b)(1) of the Act, 21 U.S.C. section 360bbb-3(b)(1), unless the authorization is terminated or revoked sooner.    Influenza A by PCR NEGATIVE NEGATIVE Final   Influenza B by PCR NEGATIVE NEGATIVE Final    Comment: (NOTE) The Xpert Xpress SARS-CoV-2/FLU/RSV assay is intended as an aid in  the diagnosis of influenza from Nasopharyngeal swab specimens and  should not be used as a sole basis for treatment. Nasal washings and  aspirates are unacceptable for Xpert Xpress SARS-CoV-2/FLU/RSV  testing. Fact Sheet for Patients: PinkCheek.be Fact Sheet for Healthcare Providers: GravelBags.it This test is not yet approved or cleared by the [place] FDA and  has been authorized for detection and/or diagnosis of SARS-CoV-2  by  FDA under an Emergency Use Authorization (EUA). This EUA will remain  in effect (meaning this test can be used) for the duration of the  Covid-19 declaration under Section 564(b)(1) of the Act, 21  U.S.C. section 360bbb-3(b)(1), unless the authorization is  terminated or revoked. Performed at [facility], [place] [address], [place], [place] [address]   Surgical pcr screen     Status: None   Collection Time: 05/13/19  4:18 AM   Specimen: Nasal Mucosa; Nasal Swab  Result Value Ref Range Status   MRSA, PCR NEGATIVE NEGATIVE Final   Staphylococcus aureus NEGATIVE NEGATIVE Final    Comment: (NOTE) The Xpert SA Assay (FDA approved for NASAL specimens in patients 28 years of age and older), is one component of a comprehensive surveillance program. It is not intended to diagnose infection nor to guide or monitor treatment. Performed at [facility], [place] [address] [place], [place] [address]          Radiology Studies: CT CHEST W CONTRAST  Result Date: 05/12/2019 CLINICAL DATA:  Metastatic disease, staging and assessment for primary. EXAM: CT CHEST, ABDOMEN, AND PELVIS WITH CONTRAST TECHNIQUE: Multidetector CT imaging of the chest, abdomen and pelvis was performed following the standard protocol during bolus administration of intravenous contrast. CONTRAST:  199mL OMNIPAQUE IOHEXOL 300 MG/ML  SOLN COMPARISON:  Chest radiograph 04/12/2019 FINDINGS: CT CHEST FINDINGS Cardiovascular: Atherosclerotic calcification of the aortic arch. Mediastinum/Nodes: No pathologic thoracic adenopathy is observed. Lungs/Pleura: Likely subpleural lymph node along the minor fissure measuring 0.7 by 0.6 by 0.2 cm shown on image 70/6. 2 mm right middle lobe nodule on image 90/4. Mild dependent atelectasis in the posterior basal segment right lower lobe. Trace adjacent right pleural effusion. 3 mm right lower lobe nodule, image 97/4. 3 mm lingular nodule, image 92/4. 0.4 by 0.3 cm left lower lobe subpleural nodule, image 111/4. 0.5 by 0.4 cm left lower lobe nodule, image 103/4. Musculoskeletal: 6.4 by 4.9 by 5.8 cm (volume = 95 cm^3) enhancing mass in the right upper breast extending to the cutaneous surface. This abuts the superficial fascia margin of the pectoralis major muscle but I do not observe definite intramuscular invasion. Scattered osseous metastatic disease. Pathologic fracture of the base of the right acromion, image 6/3. Likely pathologic fracture of the inferior tip of the left scapula with surrounding osteoid formation, image 29/3. Numerous scattered bilateral rib lesions, many with pathologic fractures. Permeative mass involving the sternal manubrium. Multilevel vertebral involvement most notable at T9, and T7. Suspected pathologic fracture of the T7 spinous process. CT ABDOMEN PELVIS FINDINGS Hepatobiliary: Indistinct scattered metastatic lesions are  present throughout the liver. Index segment 2 lesion 2.2 by 2.0 cm on image 46/3. Index right hepatic lobe lesion 2.9 by 2.6 cm on image 60/3. Multiple additional scattered lesions compatible with metastatic disease observed. Contracted gallbladder containing gallstones. Mild intrahepatic and extrahepatic biliary dilatation, cause uncertain. Pancreas: Unremarkable Spleen: Unremarkable Adrenals/Urinary Tract: Two nonobstructive left kidney lower pole calculi are present, one measuring 1.1 cm in long axis in the other measuring the same. 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 The ureters and urinary bladder partially obscured by streak artifact from the patient's right hip implant. There appears to be gas anteriorly in the urinary bladder, query recent catheterization. Left mid kidney cyst posteriorly. Stomach/Bowel: Sigmoid colon diverticulosis. Scattered diverticula of the descending colon. Vascular/Lymphatic: Aortoiliac atherosclerotic vascular disease. No pathologic adenopathy identified. Reproductive: Uterus absent.  Adnexa unremarkable. Other: Small locules of gas in the subcutaneous tissues of the right lower quadrant abdomen, possibly injection related. Musculoskeletal: Right total hip prosthesis. Permeative lesion in the right iliac crest with suspected nondisplaced pathologic fracture extending vertically in the right iliac bone. Lytic lesions in the left iliac crest and anterior superior left iliac spine. Primarily lytic lesions in the left upper sacrum. Primarily lytic lesions in the lumbar spine, including for example the right transverse process of L5. Grade 1 degenerative retrolisthesis at T11-12, T12-L1, and L1-2 with mild grade 1 anterolisthesis at L3-4 and L4-5. IMPRESSION: 1. Large (95 cubic cm) right upper breast mass likely representing recurrent breast cancer. Extensive scattered metastatic lesions throughout the skeleton, with various pathologic  fractures primarily involving the ribs and bilateral scapula as well as the right iliac bone. 2. Scattered masses in the liver are most compatible with metastatic disease. 3. There are a few scattered tiny pulmonary nodules which are nonspecific. 4. Contracted gallbladder containing gallstones. Mild intrahepatic and extrahepatic biliary dilatation, cause uncertain. 5. Nonobstructive left nephrolithiasis. 6. 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 7. Other imaging findings of potential clinical significance: Aortic Atherosclerosis (ICD10-I70.0). Sigmoid colon diverticulosis. Multilevel lumbar spondylosis and degenerative disc disease. Trace right pleural effusion. Electronically Signed   By: Van Clines M.D.   On: 05/12/2019 15:35   CT ABDOMEN PELVIS W CONTRAST  Result Date: 05/12/2019 CLINICAL DATA:  Metastatic disease, staging and assessment for primary. EXAM: CT CHEST, ABDOMEN, AND PELVIS WITH CONTRAST TECHNIQUE: Multidetector CT imaging of the chest, abdomen and pelvis was performed following the standard protocol during bolus administration of intravenous contrast. CONTRAST:  137mL OMNIPAQUE IOHEXOL 300 MG/ML  SOLN COMPARISON:  Chest radiograph 04/12/2019 FINDINGS: CT CHEST FINDINGS Cardiovascular: Atherosclerotic calcification of the aortic arch. Mediastinum/Nodes: No pathologic thoracic adenopathy is observed. Lungs/Pleura: Likely subpleural lymph node along the minor fissure measuring 0.7 by 0.6 by 0.2 cm shown on image 70/6. 2 mm right middle lobe nodule on image 90/4. Mild dependent atelectasis in the posterior basal segment right lower lobe. Trace adjacent right pleural effusion. 3 mm right lower lobe nodule, image 97/4. 3 mm lingular nodule, image 92/4. 0.4 by 0.3 cm left lower lobe subpleural nodule, image 111/4. 0.5 by 0.4 cm left lower lobe nodule, image 103/4. Musculoskeletal: 6.4 by 4.9 by 5.8 cm (volume = 95 cm^3) enhancing mass in the right upper breast extending  to the cutaneous surface. This abuts the superficial fascia margin of the pectoralis major muscle but I do not observe definite intramuscular invasion. Scattered osseous metastatic disease. Pathologic fracture of the base of the right acromion, image 6/3. Likely pathologic fracture of the inferior tip of the left scapula with surrounding osteoid formation, image 29/3. Numerous scattered bilateral rib lesions, many with pathologic fractures. Permeative mass involving the sternal manubrium. Multilevel vertebral involvement most notable at T9, and T7. Suspected pathologic fracture of the T7 spinous process. CT ABDOMEN PELVIS FINDINGS Hepatobiliary: Indistinct scattered metastatic lesions are present throughout the liver. Index segment 2 lesion 2.2 by 2.0 cm on image 46/3. Index right hepatic lobe lesion 2.9 by 2.6 cm on image 60/3. Multiple additional scattered lesions compatible with metastatic disease observed. Contracted gallbladder containing gallstones. Mild intrahepatic and extrahepatic biliary dilatation, cause uncertain. Pancreas: Unremarkable Spleen: Unremarkable Adrenals/Urinary Tract: Two nonobstructive left kidney lower pole calculi are present, one measuring 1.1 cm in long axis in the other measuring the same. 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  The ureters and urinary bladder partially obscured by streak artifact from the patient's right hip implant. There appears to be gas anteriorly in the urinary bladder, query recent catheterization. Left mid kidney cyst posteriorly. Stomach/Bowel: Sigmoid colon diverticulosis. Scattered diverticula of the descending colon. Vascular/Lymphatic: Aortoiliac atherosclerotic vascular disease. No pathologic adenopathy identified. Reproductive: Uterus absent.  Adnexa unremarkable. Other: Small locules of gas in the subcutaneous tissues of the right lower quadrant abdomen, possibly injection related. Musculoskeletal: Right  total hip prosthesis. Permeative lesion in the right iliac crest with suspected nondisplaced pathologic fracture extending vertically in the right iliac bone. Lytic lesions in the left iliac crest and anterior superior left iliac spine. Primarily lytic lesions in the left upper sacrum. Primarily lytic lesions in the lumbar spine, including for example the right transverse process of L5. Grade 1 degenerative retrolisthesis at T11-12, T12-L1, and L1-2 with mild grade 1 anterolisthesis at L3-4 and L4-5. IMPRESSION: 1. Large (95 cubic cm) right upper breast mass likely representing recurrent breast cancer. Extensive scattered metastatic lesions throughout the skeleton, with various pathologic fractures primarily involving the ribs and bilateral scapula as well as the right iliac bone. 2. Scattered masses in the liver are most compatible with metastatic disease. 3. There are a few scattered tiny pulmonary nodules which are nonspecific. 4. Contracted gallbladder containing gallstones. Mild intrahepatic and extrahepatic biliary dilatation, cause uncertain. 5. Nonobstructive left nephrolithiasis. 6. 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 7. Other imaging findings of potential clinical significance: Aortic Atherosclerosis (ICD10-I70.0). Sigmoid colon diverticulosis. Multilevel lumbar spondylosis and degenerative disc disease. Trace right pleural effusion. Electronically Signed   By: Van Clines M.D.   On: 05/12/2019 15:35   CT FEMUR LEFT W CONTRAST  Result Date: 05/12/2019 CLINICAL DATA:  Pathologic right femur fracture. Abnormal left femur x-ray EXAM: CT OF THE LOWER LEFT EXTREMITY WITH CONTRAST TECHNIQUE: Multidetector CT imaging of the lower left extremity was performed according to the standard protocol following intravenous contrast administration. COMPARISON:  Left femur x-ray 05/11/2019 CONTRAST:  161mL OMNIPAQUE IOHEXOL 300 MG/ML  SOLN FINDINGS: Bones/Joint/Cartilage At the cranial  most axial image, there is partial visualization of a healing fracture involving the anterior left iliac crest (series 3, image 1). The bone at the fracture site appears lucent and irregular which could reflect an underlying osseous lesion versus changes related to fracture. Eccentrically located lytic bone lesion within the anterolateral aspect of the subtrochanteric proximal left femur with endosteal scalloping and cortical breakthrough. Lesion measures approximately 3.5 x 2.8 cm (series 7, images 65-66; series 3, images 88-96). It is difficult to accurately assess the percentage of the medullary space occupied by this lesion on CT imaging. Suspect additional lesion within the distal femoral diaphysis where there is subtle endosteal scalloping of the lateral cortex and increased density within the marrow space measuring approximately 2.5 cm (series 7, image 66). Alignment at the hip and knee are maintained without dislocation. Ligaments Suboptimally assessed by CT. Muscles and Tendons No myotendinous abnormality is seen. Soft tissues No well-defined soft tissue mass. No fluid collection or hematoma. No inguinal lymphadenopathy. Sigmoid diverticulosis. IMPRESSION: 1. Eccentrically located lytic bone lesion within the anterolateral aspect of the subtrochanteric proximal left femur with endosteal scalloping and cortical breakthrough. Lesion is at risk for pathologic fracture. 2. Suspect additional lesion within the distal femoral diaphysis where there is subtle endosteal scalloping of the lateral cortex and increased density within the marrow space. These findings are suspicious for metastatic disease. 3. Partial visualization of a healing fracture  involving the anterior left iliac crest. The bone at the fracture site appears lucent and irregular concerning for an underlying bone lesion. Electronically Signed   By: Davina Poke D.O.   On: 05/12/2019 15:22   DG C-Arm 1-60 Min  Result Date: 05/13/2019 CLINICAL  DATA:  Intramedullary nail EXAM: LEFT FEMUR 2 VIEWS; DG C-ARM 1-60 MIN COMPARISON:  Radiography from 2 days ago FINDINGS: Multiple fluoroscopic images shows nail fixation of the left femur. By CT, a bone lesion is present proximally in the femur, not depicted on this study. IMPRESSION: Fluoroscopy for femoral fixation. Electronically Signed   By: Monte Fantasia M.D.   On: 05/13/2019 09:30   DG FEMUR MIN 2 VIEWS LEFT  Result Date: 05/13/2019 CLINICAL DATA:  Intramedullary nail EXAM: LEFT FEMUR 2 VIEWS; DG C-ARM 1-60 MIN COMPARISON:  Radiography from 2 days ago FINDINGS: Multiple fluoroscopic images shows nail fixation of the left femur. By CT, a bone lesion is present proximally in the femur, not depicted on this study. IMPRESSION: Fluoroscopy for femoral fixation. Electronically Signed   By: Monte Fantasia M.D.   On: 05/13/2019 09:30   DG FEMUR PORT MIN 2 VIEWS LEFT  Result Date: 05/13/2019 CLINICAL DATA:  Femoral nail fixation EXAM: LEFT FEMUR PORTABLE 2 VIEWS COMPARISON:  Fluoroscopy earlier today FINDINGS: Intramedullary femoral nail in expected location, as are interlocking screws proximally and distally. No complicating fracture. IMPRESSION: No unexpected finding after femoral fixation. Electronically Signed   By: Monte Fantasia M.D.   On: 05/13/2019 10:41        Scheduled Meds: . acetaminophen  1,000 mg Oral Q6H  . enoxaparin (LOVENOX) injection  40 mg Subcutaneous Q24H  . polyethylene glycol  17 g Oral Once  . senna  1 tablet Oral BID  . sodium chloride flush  3 mL Intravenous Q12H   Continuous Infusions: .  ceFAZolin (ANCEF) IV Stopped (05/13/19 [id number])     LOS: 4 days    Time spent:25 mins. More than 50% of that time was spent in counseling and/or coordination of care.      Shelly Coss, MD [facility] P3/27/2021, 8:33 AM

## 2019-05-15 DIAGNOSIS — M899 Disorder of bone, unspecified: Secondary | ICD-10-CM

## 2019-05-15 DIAGNOSIS — C799 Secondary malignant neoplasm of unspecified site: Secondary | ICD-10-CM

## 2019-05-15 LAB — PROTIME-INR
INR: 1.1 (ref 0.8–1.2)
Prothrombin Time: 14.3 seconds (ref 11.4–15.2)

## 2019-05-15 LAB — CBC
HCT: 30 % — ABNORMAL LOW (ref 36.0–46.0)
Hemoglobin: 9.6 g/dL — ABNORMAL LOW (ref 12.0–15.0)
MCH: 29.3 pg (ref 26.0–34.0)
MCHC: 32 g/dL (ref 30.0–36.0)
MCV: 91.5 fL (ref 80.0–100.0)
Platelets: 132 10*3/uL — ABNORMAL LOW (ref 150–400)
RBC: 3.28 MIL/uL — ABNORMAL LOW (ref 3.87–5.11)
RDW: 13 % (ref 11.5–15.5)
WBC: 7.3 10*3/uL (ref 4.0–10.5)
nRBC: 0 % (ref 0.0–0.2)

## 2019-05-15 NOTE — Plan of Care (Signed)
  Problem: Pain Managment: Goal: General experience of comfort will improve Outcome: Progressing   Problem: Safety: Goal: Ability to remain free from injury will improve Outcome: Progressing   Problem: Skin Integrity: Goal: Risk for impaired skin integrity will decrease Outcome: Progressing   

## 2019-05-15 NOTE — Plan of Care (Signed)
  Problem: Education: Goal: Knowledge of General Education information will improve Description: Including pain rating scale, medication(s)/side effects and non-pharmacologic comfort measures Outcome: Progressing   Problem: Health Behavior/Discharge Planning: Goal: Ability to manage health-related needs will improve Outcome: Progressing   Problem: Nutrition: Goal: Adequate nutrition will be maintained Outcome: Progressing   Problem: Clinical Measurements: Goal: Ability to maintain clinical measurements within normal limits will improve Outcome: Completed/Met Goal: Respiratory complications will improve Outcome: Completed/Met Goal: Cardiovascular complication will be avoided Outcome: Completed/Met   Problem: Clinical Measurements: Goal: Ability to maintain clinical measurements within normal limits will improve Outcome: Completed/Met   Problem: Clinical Measurements: Goal: Respiratory complications will improve Outcome: Completed/Met   Problem: Clinical Measurements: Goal: Cardiovascular complication will be avoided Outcome: Completed/Met

## 2019-05-15 NOTE — Progress Notes (Addendum)
PT Cancellation Note  Patient Details Name: Yesenia Sullivan MRN: [id number] DOB: [birth year]   Cancelled Treatment:    Reason Eval/Treat Not Completed: Patient declined, no reason specified Pt states that she is still unsure of the support she will have post discharge. She states her friend is coming down from up [place] and will be here this afternoon to discuss in detail discharge planning so is unable to perform re-evaluation at this time. Pt states she is able to ambulate to BR with pain, but is able to with assistance. Wishes PT to come tomorrow to perform re-evaluation. Pt verbalized understanding to pain education limits and emphasis on bed exercises currently.   Ann Held PT, DPT Acute Rehab [facility] P: [phone]   Renato Gails 05/15/2019, 11:24 AM

## 2019-05-15 NOTE — Progress Notes (Signed)
Subjective: 2 Days Post-Op s/p Procedure(s): INTRAMEDULLARY (IM) NAIL INTERTROCHANTRIC   Patient is alert, oriented, laying comfortably in bed. States pain is well controlled now but can get to a moderate level when getting up with therapy or to use the bathroom. Denies chest pain, shortness of breath, nausea, or vomiting.   Objective:  PE: VITALS:   Vitals:   05/14/19 1232 05/14/19 1929 05/15/19 0443 05/15/19 0741  BP: 122/68 122/71 112/65 118/71  Pulse: 79 81 64 74  Resp: 18 18 16 18   Temp: 98.6 F (37 C) 99.4 F (37.4 C) 98.4 F (36.9 C) 98.4 F (36.9 C)  TempSrc: Oral Oral Oral Oral  SpO2: 99% 98% 98% 93%  Weight:      Height:       Gen: Alert and oriented, sitting comfortably in bed Resp: Unlabored, no use of accessory musculature MSK: RLE - Surgical dressings with no drainage. Dressings removed and incisions show no drainage and no signs of infection. EHL and FHL intact. Able to move all toes of right foot without pain. 2+ DP pulse. Distal sensation intact. Compartments compressible.  LLE: Surgical dressings with no drainage. Dressings removed and incisions show no drainage and no signs of infection. EHL and FHL intact. Able to move all toes without pain. 2+ DP pulse. Distal sensation intact.   LABS  Results for orders placed or performed during the hospital encounter of 05/10/19 (from the past 24 hour(s))  CBC     Status: Abnormal   Collection Time: 05/15/19  6:26 AM  Result Value Ref Range   WBC 7.3 4.0 - 10.5 K/uL   RBC 3.28 (L) 3.87 - 5.11 MIL/uL   Hemoglobin 9.6 (L) 12.0 - 15.0 g/dL   HCT 30.0 (L) 36.0 - 46.0 %   MCV 91.5 80.0 - 100.0 fL   MCH 29.3 26.0 - 34.0 pg   MCHC 32.0 30.0 - 36.0 g/dL   RDW 13.0 11.5 - 15.5 %   Platelets 132 (L) 150 - 400 K/uL   nRBC 0.0 0.0 - 0.2 %  Protime-INR     Status: None   Collection Time: 05/15/19  6:26 AM  Result Value Ref Range   Prothrombin Time 14.3 11.4 - 15.2 seconds   INR 1.1 0.8 - 1.2    MR BRAIN W WO  CONTRAST  Result Date: 05/14/2019 CLINICAL DATA:  Rule out metastatic disease.  Right breast lump. EXAM: MRI HEAD WITHOUT AND WITH CONTRAST TECHNIQUE: Multiplanar, multiecho pulse sequences of the brain and surrounding structures were obtained without and with intravenous contrast. CONTRAST:  7.53mL GADAVIST GADOBUTROL 1 MMOL/ML IV SOLN COMPARISON:  MRI head without contrast 05/04/2019 FINDINGS: Brain: Ventricle size normal. Mild cortical atrophy. Negative for acute infarct. Small white matter hyperintensities bilaterally consistent with chronic microvascular ischemia. Negative for hemorrhage or mass. Postcontrast imaging demonstrates no enhancing metastatic deposits in the brain. Vascular: Normal arterial flow voids Skull and upper cervical spine: 12 mm enhancing lesion right frontal bone. Infiltrative bone marrow lesions at C2 and C4. These findings are compatible with skeletal metastatic disease. Sinuses/Orbits: Mild mucosal edema paranasal sinuses. Bilateral cataract extraction Other: None IMPRESSION: Negative for metastatic disease to the brain Skeletal metastatic disease right frontal bone, C2, C4 vertebral bodies. Electronically Signed   By: Franchot Gallo M.D.   On: 05/14/2019 10:32   DG C-Arm 1-60 Min  Result Date: 05/13/2019 CLINICAL DATA:  Intramedullary nail EXAM: LEFT FEMUR 2 VIEWS; DG C-ARM 1-60 MIN COMPARISON:  Radiography from 2 days  ago FINDINGS: Multiple fluoroscopic images shows nail fixation of the left femur. By CT, a bone lesion is present proximally in the femur, not depicted on this study. IMPRESSION: Fluoroscopy for femoral fixation. Electronically Signed   By: Monte Fantasia M.D.   On: 05/13/2019 09:30   DG FEMUR MIN 2 VIEWS LEFT  Result Date: 05/13/2019 CLINICAL DATA:  Intramedullary nail EXAM: LEFT FEMUR 2 VIEWS; DG C-ARM 1-60 MIN COMPARISON:  Radiography from 2 days ago FINDINGS: Multiple fluoroscopic images shows nail fixation of the left femur. By CT, a bone lesion is present  proximally in the femur, not depicted on this study. IMPRESSION: Fluoroscopy for femoral fixation. Electronically Signed   By: Monte Fantasia M.D.   On: 05/13/2019 09:30   DG FEMUR PORT MIN 2 VIEWS LEFT  Result Date: 05/13/2019 CLINICAL DATA:  Femoral nail fixation EXAM: LEFT FEMUR PORTABLE 2 VIEWS COMPARISON:  Fluoroscopy earlier today FINDINGS: Intramedullary femoral nail in expected location, as are interlocking screws proximally and distally. No complicating fracture. IMPRESSION: No unexpected finding after femoral fixation. Electronically Signed   By: Monte Fantasia M.D.   On: 05/13/2019 10:41    Assessment/Plan:  Right pathologic femur fracture Left pathologic lesion of femur: 3/24 - ORIF R femur fracture w/ bone biopsy of right femur 3/26- prophylactic IMN L femur - Weightbearing: TDWB RLE, WBAT LLE - Insicional and dressing care: Dressings removed today, leave open to air - VTE prophylaxis: Lovenox restarted, Hbg stable at 9.6 this morning, SCD's - Pain control: continue current regimen 1. Tylenol1000 mg q 6 hours scheduled 2. Robaxin 500 mg q 6 hours PRN 3. Oxycodone 5-10 mg q 4 hours PRN 4. Dilaudid 1 mg q 3 hours PRN  Dispo: PT/OT evaluation for HH vs SNF recommendations, patient declined PT eval yesterday. Patient okay for discharge from orthopedic standpoint when cleared by therapy and medical team, she is scheduled for liver biopsy tomorrow.   Follow - up plan: Follow up with Dr. Doreatha Martin in 2 weeks. Will need breast biopsy and subsequent oncology eval as outpatient.   Contact information:   Weekdays 8-5 [address], [place] [phone] A fter hours and holidays please check [email or web address] for group call information for [facility]  Ventura Bruns 05/15/2019, 7:53 AM

## 2019-05-15 NOTE — Progress Notes (Signed)
PROGRESS NOTE    Yesenia Sullivan  [id number] DOB: 09-19-1945 DOA: 05/10/2019 PCP: Patient, No Pcp Per   Brief Narrative:  Patient is a 74 female with no significant past medical history who presented to the emergency department on 3/23 with complaints of left lower extremity pain, swelling.   She also reported noticing of right breast lump about a year ago. On 3/17, she had presented to the emergency department, at the time MRI of the brain showed findings suspicious for osseous metastatic lesion. She wanted to be discharged and follow as an outpatient She has been seen by her physician on 3/23 and has been referred to breast clinic for further evaluation. On the way out of her PCPs office, she suddenly felt a pop on her right thigh followed by severe pain and obvious deformity.  X-ray done in the emergency room showed right femur fracture which was severely displaced and angulated. CT scan of the left femur showed lytic lesion in the peritrochanteric region with more than one third of the femur involved. Orthopedics consulted.  Underwent nailing of left femur on 05/13/2019.  She is waiting for PT/OT evaluation Initiated breast cancer work-up.  Assessment & Plan:   Active Problems:   Closed femur fracture (HCC)   Right closed femur fracture: Likely pathological fracture. Imaging findings as above.Underwent  nailing of the left femur. Continue pain management.  PT/OT evaluation pending.  Elevated blood pressure: Most likely secondary to pain. Continue to monitor blood pressure. Continue as needed medication.No H/O HTN  Right breast mass with evidence of  metastasis: She had felt right breast lump a year ago but did not want to seek medical attention. She has been followed by her PCP and has been referred to breast clinic. MRI of the brain done on 3/17 due to pain on extremities, trouble walking. It showed 13 mm focus of T2 and diffusion-weighted signal hyperintensity in the right  frontoparietal area, suspicious for metastatic disease. Case has been discussed with Dr.Zhao, oncology.CT chest/abdomen/pelvis with contrast done here showed large left-sided breast mass, diffuse metastatic skeletal lesions with pathological fractures of the ribs, hepatic metastasis. Plan for liver biopsy for diagnosis on Monday MRI of the brain done on this admission showed no metastatic lesions but showed skeletal metastatic disease right frontal bone, C2, C4 vertebral bodies.  Constipation: Continue bowel regimen         DVT prophylaxis:Lovenox Code Status: Full code Family Communication: Patient understands the plan. Brother at bed side Disposition Plan: Patient is from home. Likely needs rehab/nursing facility with femur fracture. Waiting for PT/OT evaluation.  Also waiting for cancer work-up   Consultants: Orthopedics, oncology  Procedures: Nailing of right femur  Antimicrobials:  Anti-infectives (From admission, onward)   Start     Dose/Rate Route Frequency Ordered Stop   05/13/19 2000  ceFAZolin (ANCEF) IVPB 2g/100 mL premix     2 g 200 mL/hr over 30 Minutes Intravenous Every 12 hours 05/13/19 1117 05/14/19 1159   05/11/19 2000  ceFAZolin (ANCEF) IVPB 2g/100 mL premix     2 g 200 mL/hr over 30 Minutes Intravenous Every 8 hours 05/11/19 1555 05/12/19 1300   05/11/19 1345  vancomycin (VANCOCIN) powder  Status:  Discontinued       As needed 05/11/19 1346 05/11/19 1419   05/11/19 1159  ceFAZolin (ANCEF) 2-4 GM/100ML-% IVPB    Note to Pharmacy: [name]   : cabinet override      05/11/19 1159 05/11/19 2206      Subjective:  Patient seen and examined at bedside this morning.  Hemodynamically stable.  Comfortable.  Pain well controlled.  Objective: Vitals:   05/14/19 1232 05/14/19 1929 05/15/19 0443 05/15/19 0741  BP: 122/68 122/71 112/65 118/71  Pulse: 79 81 64 74  Resp: 18 18 16 18   Temp: 98.6 F (37 C) 99.4 F (37.4 C) 98.4 F (36.9 C) 98.4 F (36.9 C)    TempSrc: Oral Oral Oral Oral  SpO2: 99% 98% 98% 93%  Weight:      Height:        Intake/Output Summary (Last 24 hours) at 05/15/2019 [id number] Last data filed at 05/14/2019 1733 Gross per 24 hour  Intake 720 ml  Output --  Net 720 ml   Filed Weights   05/10/19 1416 05/11/19 1115  Weight: 74.4 kg 73.9 kg    Examination:    General exam: Comfortable Respiratory system: Bilateral equal air entry, normal vesicular breath sounds, no wheezes or crackles  Cardiovascular system: S1 & S2 heard, RRR Breast:Large hard breast mass on the right side with erythematous change/ulceration of the overlying skin Gastrointestinal system: Abdomen is nondistended, soft and nontender.  Central nervous system: Alert and oriented. No focal neurological deficits. Extremities: Clean surgical wound Skin: No rashes, lesions or ulcers,no icterus ,no pallor    Data Reviewed: I have personally reviewed following labs and imaging studies  CBC: Recent Labs  Lab 05/10/19 1530 05/10/19 1530 05/11/19 0333 05/12/19 0433 05/13/19 0406 05/14/19 0418 05/15/19 0626  WBC 11.2*   < > 8.8 8.7 7.9 6.6 7.3  NEUTROABS 10.1*  --   --   --   --   --   --   HGB 12.6   < > 12.1 10.6* 9.8* 9.7* 9.6*  HCT 38.2   < > 37.2 32.1* 30.8* 30.3* 30.0*  MCV 90.7   < > 92.5 91.7 93.1 93.2 91.5  PLT 180   < > 159 150 139* 121* 132*   < > = values in this interval not displayed.   Basic Metabolic Panel: Recent Labs  Lab 05/10/19 1530 05/11/19 0333 05/12/19 0433 05/13/19 0406  NA 139 139 138 139  K 3.9 4.0 5.0 4.1  CL 102 104 101 101  CO2 25 26 27 29   GLUCOSE 130* 91 108* 91  BUN 27* 31* 20 20  CREATININE 0.74 0.96 0.90 1.06*  CALCIUM 9.1 9.0 8.6* 8.9   GFR: Estimated Creatinine Clearance: 44.5 mL/min (A) (by C-G formula based on SCr of 1.06 mg/dL (H)). Liver Function Tests: Recent Labs  Lab 05/10/19 1530 05/12/19 0939  AST 51* 93*  ALT 20 68*  ALKPHOS 168* 161*  BILITOT 0.6 0.5  PROT 7.6 6.3*  ALBUMIN 4.4  3.3*   No results for input(s): LIPASE, AMYLASE in the last 168 hours. No results for input(s): AMMONIA in the last 168 hours. Coagulation Profile: Recent Labs  Lab 05/15/19 0626  INR 1.1   Cardiac Enzymes: No results for input(s): CKTOTAL, CKMB, CKMBINDEX, TROPONINI in the last 168 hours. BNP (last 3 results) No results for input(s): PROBNP in the last 8760 hours. HbA1C: No results for input(s): HGBA1C in the last 72 hours. CBG: No results for input(s): GLUCAP in the last 168 hours. Lipid Profile: No results for input(s): CHOL, HDL, LDLCALC, TRIG, CHOLHDL, LDLDIRECT in the last 72 hours. Thyroid Function Tests: No results for input(s): TSH, T4TOTAL, FREET4, T3FREE, THYROIDAB in the last 72 hours. Anemia Panel: No results for input(s): VITAMINB12, FOLATE, FERRITIN, TIBC, IRON, RETICCTPCT in the  last 72 hours. Sepsis Labs: No results for input(s): PROCALCITON, LATICACIDVEN in the last 168 hours.  Recent Results (from the past 240 hour(s))  Respiratory Panel by RT PCR (Flu A&B, Covid) - Nasopharyngeal Swab     Status: None   Collection Time: 05/10/19  4:46 PM   Specimen: Nasopharyngeal Swab  Result Value Ref Range Status   SARS Coronavirus 2 by RT PCR NEGATIVE NEGATIVE Final    Comment: (NOTE) SARS-CoV-2 target nucleic acids are NOT DETECTED. The SARS-CoV-2 RNA is generally detectable in upper respiratoy specimens during the acute phase of infection. The lowest concentration of SARS-CoV-2 viral copies this assay can detect is 131 copies/mL. A negative result does not preclude SARS-Cov-2 infection and should not be used as the sole basis for treatment or other patient management decisions. A negative result may occur with  improper specimen collection/handling, submission of specimen other than nasopharyngeal swab, presence of viral mutation(s) within the areas targeted by this assay, and inadequate number of viral copies (<131 copies/mL). A negative result must be combined  with clinical observations, patient history, and epidemiological information. The expected result is Negative. Fact Sheet for Patients:  PinkCheek.be Fact Sheet for Healthcare Providers:  GravelBags.it This test is not yet ap proved or cleared by the [place] FDA and  has been authorized for detection and/or diagnosis of SARS-CoV-2 by FDA under an Emergency Use Authorization (EUA). This EUA will remain  in effect (meaning this test can be used) for the duration of the COVID-19 declaration under Section 564(b)(1) of the Act, 21 U.S.C. section 360bbb-3(b)(1), unless the authorization is terminated or revoked sooner.    Influenza A by PCR NEGATIVE NEGATIVE Final   Influenza B by PCR NEGATIVE NEGATIVE Final    Comment: (NOTE) The Xpert Xpress SARS-CoV-2/FLU/RSV assay is intended as an aid in  the diagnosis of influenza from Nasopharyngeal swab specimens and  should not be used as a sole basis for treatment. Nasal washings and  aspirates are unacceptable for Xpert Xpress SARS-CoV-2/FLU/RSV  testing. Fact Sheet for Patients: PinkCheek.be Fact Sheet for Healthcare Providers: GravelBags.it This test is not yet approved or cleared by the [place] FDA and  has been authorized for detection and/or diagnosis of SARS-CoV-2 by  FDA under an Emergency Use Authorization (EUA). This EUA will remain  in effect (meaning this test can be used) for the duration of the  Covid-19 declaration under Section 564(b)(1) of the Act, 21  U.S.C. section 360bbb-3(b)(1), unless the authorization is  terminated or revoked. Performed at [facility], [place] [address], [place], [place] [address]   Surgical pcr screen     Status: None   Collection Time: 05/13/19  4:18 AM   Specimen: Nasal Mucosa; Nasal Swab  Result Value Ref Range Status   MRSA, PCR NEGATIVE NEGATIVE Final     Staphylococcus aureus NEGATIVE NEGATIVE Final    Comment: (NOTE) The Xpert SA Assay (FDA approved for NASAL specimens in patients 18 years of age and older), is one component of a comprehensive surveillance program. It is not intended to diagnose infection nor to guide or monitor treatment. Performed at [facility], [place] [address], [place], [place] [address]          Radiology Studies: MR BRAIN W WO CONTRAST  Result Date: 05/14/2019 CLINICAL DATA:  Rule out metastatic disease.  Right breast lump. EXAM: MRI HEAD WITHOUT AND WITH CONTRAST TECHNIQUE: Multiplanar, multiecho pulse sequences of the brain and surrounding structures were obtained without  and with intravenous contrast. CONTRAST:  7.62mL GADAVIST GADOBUTROL 1 MMOL/ML IV SOLN COMPARISON:  MRI head without contrast 05/04/2019 FINDINGS: Brain: Ventricle size normal. Mild cortical atrophy. Negative for acute infarct. Small white matter hyperintensities bilaterally consistent with chronic microvascular ischemia. Negative for hemorrhage or mass. Postcontrast imaging demonstrates no enhancing metastatic deposits in the brain. Vascular: Normal arterial flow voids Skull and upper cervical spine: 12 mm enhancing lesion right frontal bone. Infiltrative bone marrow lesions at C2 and C4. These findings are compatible with skeletal metastatic disease. Sinuses/Orbits: Mild mucosal edema paranasal sinuses. Bilateral cataract extraction Other: None IMPRESSION: Negative for metastatic disease to the brain Skeletal metastatic disease right frontal bone, C2, C4 vertebral bodies. Electronically Signed   By: Franchot Gallo M.D.   On: 05/14/2019 10:32   DG C-Arm 1-60 Min  Result Date: 05/13/2019 CLINICAL DATA:  Intramedullary nail EXAM: LEFT FEMUR 2 VIEWS; DG C-ARM 1-60 MIN COMPARISON:  Radiography from 2 days ago FINDINGS: Multiple fluoroscopic images shows nail fixation of the left femur. By CT, a bone lesion is present proximally in the femur, not  depicted on this study. IMPRESSION: Fluoroscopy for femoral fixation. Electronically Signed   By: Monte Fantasia M.D.   On: 05/13/2019 09:30   DG FEMUR MIN 2 VIEWS LEFT  Result Date: 05/13/2019 CLINICAL DATA:  Intramedullary nail EXAM: LEFT FEMUR 2 VIEWS; DG C-ARM 1-60 MIN COMPARISON:  Radiography from 2 days ago FINDINGS: Multiple fluoroscopic images shows nail fixation of the left femur. By CT, a bone lesion is present proximally in the femur, not depicted on this study. IMPRESSION: Fluoroscopy for femoral fixation. Electronically Signed   By: Monte Fantasia M.D.   On: 05/13/2019 09:30   DG FEMUR PORT MIN 2 VIEWS LEFT  Result Date: 05/13/2019 CLINICAL DATA:  Femoral nail fixation EXAM: LEFT FEMUR PORTABLE 2 VIEWS COMPARISON:  Fluoroscopy earlier today FINDINGS: Intramedullary femoral nail in expected location, as are interlocking screws proximally and distally. No complicating fracture. IMPRESSION: No unexpected finding after femoral fixation. Electronically Signed   By: Monte Fantasia M.D.   On: 05/13/2019 10:41        Scheduled Meds: . acetaminophen  1,000 mg Oral Q6H  . enoxaparin (LOVENOX) injection  40 mg Subcutaneous Q24H  . polyethylene glycol  17 g Oral Once  . senna  1 tablet Oral BID  . sodium chloride flush  3 mL Intravenous Q12H   Continuous Infusions:    LOS: 5 days    Time spent:25 mins. More than 50% of that time was spent in counseling and/or coordination of care.      Shelly Coss, MD [facility] P3/28/2021, 8:42 AM

## 2019-05-16 ENCOUNTER — Encounter: Payer: Self-pay | Admitting: *Deleted

## 2019-05-16 ENCOUNTER — Inpatient Hospital Stay (HOSPITAL_COMMUNITY): Payer: PPO

## 2019-05-16 DIAGNOSIS — C787 Secondary malignant neoplasm of liver and intrahepatic bile duct: Secondary | ICD-10-CM

## 2019-05-16 DIAGNOSIS — Z7189 Other specified counseling: Secondary | ICD-10-CM

## 2019-05-16 DIAGNOSIS — C7951 Secondary malignant neoplasm of bone: Secondary | ICD-10-CM

## 2019-05-16 DIAGNOSIS — C50911 Malignant neoplasm of unspecified site of right female breast: Secondary | ICD-10-CM

## 2019-05-16 DIAGNOSIS — M84451S Pathological fracture, right femur, sequela: Secondary | ICD-10-CM

## 2019-05-16 LAB — CBC
HCT: 27.7 % — ABNORMAL LOW (ref 36.0–46.0)
Hemoglobin: 9.3 g/dL — ABNORMAL LOW (ref 12.0–15.0)
MCH: 30.9 pg (ref 26.0–34.0)
MCHC: 33.6 g/dL (ref 30.0–36.0)
MCV: 92 fL (ref 80.0–100.0)
Platelets: 146 10*3/uL — ABNORMAL LOW (ref 150–400)
RBC: 3.01 MIL/uL — ABNORMAL LOW (ref 3.87–5.11)
RDW: 13.2 % (ref 11.5–15.5)
WBC: 7.7 10*3/uL (ref 4.0–10.5)
nRBC: 0 % (ref 0.0–0.2)

## 2019-05-16 LAB — IMMUNOFIXATION ELECTROPHORESIS
IgA: 124 mg/dL (ref 64–422)
IgG (Immunoglobin G), Serum: 1045 mg/dL (ref 586–1602)
IgM (Immunoglobulin M), Srm: 104 mg/dL (ref 26–217)
Total Protein ELP: 6.6 g/dL (ref 6.0–8.5)

## 2019-05-16 LAB — PROTEIN ELECTROPHORESIS, SERUM
A/G Ratio: 1.1 (ref 0.7–1.7)
Albumin ELP: 3.3 g/dL (ref 2.9–4.4)
Alpha-1-Globulin: 0.3 g/dL (ref 0.0–0.4)
Alpha-2-Globulin: 0.8 g/dL (ref 0.4–1.0)
Beta Globulin: 0.8 g/dL (ref 0.7–1.3)
Gamma Globulin: 1 g/dL (ref 0.4–1.8)
Globulin, Total: 3 g/dL (ref 2.2–3.9)
Total Protein ELP: 6.3 g/dL (ref 6.0–8.5)

## 2019-05-16 NOTE — Progress Notes (Signed)
Occupational Therapy Treatment Patient Details Name: Yesenia Sullivan MRN: [id number] DOB: Feb 06, 1946 Today's Date: 05/16/2019    History of present illness 74 yo admitted as she felt her leg pop leaving doctors office with pathologic right femur fx s/p ORIF. 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 Pt noted breast lump a year ago but did not seek medical tx. MRI 3/17 with right frontoparietal hyperintensity suspiscious for metastasis. Lesion of left femur noted and elective Im nail of left femur performed 3/26. PMHx: Rt THA   OT comments  Pt continues to progress toward established OT goals. Upon arrival pt sitting EOB and agreeable to session with OT/PT. Pt required minA+2 for sit<>stand with cues for compliance to weight bearing status. Pt demonstrates decreased ability to maintain TDWB status of RLE and demonstrates weakness in BUE (RUE weaker than LUE). With ambulation in room, noted an audible pop from pt's R shoulder, pt reported increased pain. RN notified. Pt required setupA for grooming while seated. Pt will continue to benefit from skilled OT services to maximize safety and independence with ADL/IADL and functional mobility. Will continue to follow acutely and progress as tolerated.    Follow Up Recommendations  Home health OT    Equipment Recommendations  3 in 1 bedside commode;Wheelchair (measurements OT);Other (comment)(long handle bath sponge)    Recommendations for Other Services      Precautions / Restrictions Precautions Precautions: Fall Restrictions Weight Bearing Restrictions: Yes RLE Weight Bearing: Touchdown weight bearing LLE Weight Bearing: Weight bearing as tolerated       Mobility Bed Mobility               General bed mobility comments: pt sitting EOB upon arrival  Transfers Overall transfer level: Needs assistance Equipment used: Rolling walker (2 wheeled) Transfers: Sit to/from Stand Sit to Stand: Min assist;+2  safety/equipment         General transfer comment: cues for hand placement, cues for WB status and to kick RLE out prior to sitting to ensure adherence to WB    Balance Overall balance assessment: Needs assistance Sitting-balance support: Feet unsupported;No upper extremity supported Sitting balance-Leahy Scale: Good     Standing balance support: Bilateral upper extremity supported;During functional activity Standing balance-Leahy Scale: Fair Standing balance comment: pt reports weakness in BUE (RUE weaker than LUE) heavy reliance on BUE support on RW, pt with decreased adherence to weight bearing precaution with ambulation;discussed limiting mobility to stand pivot to maximize adherence to WB status to ensure proper healing                           ADL either performed or assessed with clinical judgement   ADL Overall ADL's : Needs assistance/impaired     Grooming: Set up;Sitting;Wash/dry hands;Wash/dry face;Oral care Grooming Details (indicate cue type and reason): setupA with pt sitting in chair                 Toilet Transfer: Moderate assistance;RW;Ambulation Toilet Transfer Details (indicate cue type and reason): pt required consistent cues for WB status Toileting- Clothing Manipulation and Hygiene: Minimal assistance;Sit to/from stand Toileting - Clothing Manipulation Details (indicate cue type and reason): completed in standing     Functional mobility during ADLs: Moderate assistance;Rolling walker;Cueing for safety General ADL Comments: pt with decreased adherence to weight bearing precautions, required frequent cues;discussed limiting mobiltiy to stand-pivot and complete ADL in sitting for adherence to WB      Vision  Perception     Praxis      Cognition Arousal/Alertness: Awake/alert Behavior During Therapy: WFL for tasks assessed/performed Overall Cognitive Status: Within Functional Limits for tasks assessed                                  General Comments: pt requesting to discuss with palliative        Exercises     Shoulder Instructions       General Comments pt's friend present throughout session, she reports she is going to move in wtih the patient to provide as much care and assistance as needed    Pertinent Vitals/ Pain       Pain Assessment: 0-10 Pain Score: 5  Pain Location: Right shoulder Pain Descriptors / Indicators: Sore;Grimacing Pain Intervention(s): Monitored during session;Limited activity within patient's tolerance;Repositioned  Home Living                                          Prior Functioning/Environment              Frequency  Min 2X/week        Progress Toward Goals  OT Goals(current goals can now be found in the care plan section)  Progress towards OT goals: Progressing toward goals  Acute Rehab OT Goals Patient Stated Goal: return home OT Goal Formulation: With patient Time For Goal Achievement: 05/26/19 Potential to Achieve Goals: Good ADL Goals Pt Will Perform Lower Body Bathing: with min assist;with min guard assist;with adaptive equipment;with caregiver independent in assisting Pt Will Perform Lower Body Dressing: with min assist;with min guard assist;with adaptive equipment;with caregiver independent in assisting Pt Will Transfer to Toilet: with min guard assist;stand pivot transfer Pt Will Perform Toileting - Clothing Manipulation and hygiene: with min guard assist;sit to/from stand Pt Will Perform Tub/Shower Transfer: with min guard assist;rolling walker;Stand pivot transfer  Plan Discharge plan remains appropriate    Co-evaluation    PT/OT/SLP Co-Evaluation/Treatment: Yes Reason for Co-Treatment: For patient/therapist safety;To address functional/ADL transfers   OT goals addressed during session: ADL's and self-care      AM-PAC OT "6 Clicks" Daily Activity     Outcome Measure   Help from another person eating  meals?: None Help from another person taking care of personal grooming?: A Little Help from another person toileting, which includes using toliet, bedpan, or urinal?: A Little Help from another person bathing (including washing, rinsing, drying)?: A Lot Help from another person to put on and taking off regular upper body clothing?: A Little Help from another person to put on and taking off regular lower body clothing?: A Lot 6 Click Score: 17    End of Session Equipment Utilized During Treatment: Gait belt;Rolling walker  OT Visit Diagnosis: Unsteadiness on feet (R26.81);Other abnormalities of gait and mobility (R26.89);Muscle weakness (generalized) (M62.81);Pain Pain - Right/Left: Left Pain - part of body: Leg   Activity Tolerance Patient tolerated treatment well   Patient Left in chair;with call bell/phone within reach;with chair alarm set   Nurse Communication Mobility status        Time: [id number] OT Time Calculation (min): 28 min  Charges: OT General Charges $OT Visit: 1 Visit OT Treatments $Self Care/Home Management : 8-22 mins  Helene Kelp OTR/L Acute Rehabilitation Services Office: [place] 05/16/2019, 1:17 PM

## 2019-05-16 NOTE — Progress Notes (Signed)
Patient ID: Yesenia Sullivan, female   DOB: 06-10-1945, 74 y.o.   MRN: [id number]   LOS: 6 days   Subjective: C/o right shoulder pain, worse after hearing a pop earlier.   Objective: Vital signs in last 24 hours: Temp:  [98.2 F (36.8 C)-99 F (37.2 C)] 98.4 F (36.9 C) (03/29 0720) Pulse Rate:  [63-85] 80 (03/29 0720) Resp:  [17-18] 18 (03/29 0720) BP: (111-132)/(60-72) 119/67 (03/29 0720) SpO2:  [91 %-95 %] 91 % (03/29 0720) Last BM Date: 05/14/19   Laboratory  CBC Recent Labs    05/15/19 0626 05/16/19 0502  WBC 7.3 7.7  HGB 9.6* 9.3*  HCT 30.0* 27.7*  PLT 132* 146*   BMET No results for input(s): NA, K, CL, CO2, GLUCOSE, BUN, CREATININE, CALCIUM in the last 72 hours.   Physical Exam General appearance: alert and no distress   Assessment/Plan: Right acromion fx -- Will order sling for comfort but she has no restrictions on motion or weightbearing from this.    Lisette Abu, PA-C Orthopedic Surgery [phone] 05/16/2019

## 2019-05-16 NOTE — Consult Note (Signed)
[facility] CONSULT NOTE  Patient Care Team: Patient, No Pcp Per as PCP - General (General Practice)  HEME/ONC OVERVIEW: 1. Metastatic breast carcinoma w/ extensive bone and liver metastases  -04/2019:  Lytic bone lesions involving left femur at risk for fracture; pathologic R distal femur fracture s/p ORIF   Large R upper breast mass with extensive skeletal (ribs, scapula, etc.) and liver metastases; subcentimeter bilateral pulmonary nodules   No brain metastases; R frontal bone met noted   R femur biopsy: metastatic carcinoma, c/w breast primary; ER, PR, and HER2 pending   Assessment & Plan:  Metastatic breast carcinoma w/ extensive bone and liver metastases  -I reviewed the patient's records in detail, including hospital course, lab studies, imaging results and pathology reports -I also independently reviewed the radiologic images of CT chest, abdomen/pelvis, and agreed with the findings as documented -In summary, patient first noticed a lump in her right breast over a year ago, but did not seek further evaluation.  She had been referred to the breast clinic, but has not yet had any biopsy.  She presented to [facility] ER in late 04/2019 for evaluation of right lower extremity pain, and was found with a pathologic distal right femur fracture, for which she underwent ORIF.  CT CAP showed a large right breast mass, concerning for primary breast malignancy.  There was extensive skeletal and liver metastases.  The biopsy from the right femur showed metastatic carcinoma, consistent with breast primary.  ER/PR/HER2 stains are pending.  Oncology was consulted for further management. -I reviewed the pathology results in detail with Dr. Tresa Moore, who indicated that there should be sufficient tissue for IHC, and therefore we can defer the liver biopsy at this time. -I reviewed the imaging and pathology results in detail with the patient -In light of the metastatic breast cancer with extensive  skeletal liver involvement, the goal of treatment is for palliative intent only, not curative -Due to ER/PR/Her2 stain pending, we do not yet know her treatment options at this time -Patient expressed desire to pursue hospice, and would like to discuss with palliative care/hospice team regarding her options -If the patient changes her mind regarding treatment, we will be glad to discuss her treatment options further  Extensive bony metastases -Given the extensive bony metastases at risk for fracture, including the left femur, I recommend administering Zometa 71m x 1 prior to discharge -We discussed the rationale for bisphosphonate, as well as some of the potential side effects -Patient expressed understanding, and was agreeable to receive Zometa  -Continue pain management per the hospitalist team   Goals of care discussion -In light of the widely metastatic breast cancer, I informed the patient that the goal of treatment will be for palliative intent only, not curative -I also encouraged patient to discuss with her family regarding her CODE STATUS, including her wish in the event of emergency regarding chest compressions and intubation -Patient expressed understanding, and was leaning toward pursuing hospice -If the patient changes her mind regarding her treatment, we will be glad to discuss her treatment options at that time  Thank you for the consult.  Please do not hesitate to contact [place] there are any questions.   YTish Men MD 3/29/202112:05 PM  PURPOSE OF CONSULTATION:  Newly diagnosed metastatic breast cancer  HISTORY OF PRESENTING ILLNESS:  Ms. BKingdonis a 74year old female [facility], and was found with pathologic right femur fracture.  Oncology was consulted  for further management of metastatic breast cancer.  Patient first noticed a lump in her right breast over a year ago, but did not seek further evaluation.  She had been  referred to the breast clinic, but has not yet had any biopsy.  She presented to [facility] ER in late 04/2019 for evaluation of right lower extremity pain, and was found with a pathologic distal right femur fracture, for which she underwent ORIF.  CT CAP showed a large right breast mass, concerning for primary breast malignancy.  There was extensive skeletal and liver metastases.  The biopsy from the right femur showed metastatic carcinoma, consistent with breast primary.  ER/PR/HER2 stains are pending.    Patient reports that she has significant generalized bone pain, particularly in her right shoulder and hips.  When asked the reason for delaying seeking care for the breast mass, she reported that she was "hoping for divine healing."  She expressed strong interest for pursuing hospice.    REVIEW OF SYSTEMS:   Constitutional: ( - ) fevers, ( - )  chills , ( - ) night sweats Eyes: ( - ) blurriness of vision, ( - ) double vision, ( - ) watery eyes Ears, nose, mouth, throat, and face: ( - ) mucositis, ( - ) sore throat Respiratory: ( - ) cough, ( - ) dyspnea, ( - ) wheezes Cardiovascular: ( - ) palpitation, ( - ) chest discomfort, ( - ) lower extremity swelling Gastrointestinal:  ( - ) nausea, ( - ) heartburn, ( - ) change in bowel habits Skin: ( - ) abnormal skin rashes Lymphatics: ( - ) new lymphadenopathy, ( - ) easy bruising Neurological: ( - ) numbness, ( - ) tingling, ( - ) new weaknesses Behavioral/Psych: ( - ) mood change, ( - ) new changes  All other systems were reviewed with the patient and are negative.  I have reviewed her chart and materials related to her cancer extensively and collaborated history with the patient. Summary of oncologic history is as follows: Oncology History   No history exists.    MEDICAL HISTORY:  Past Medical History:  Diagnosis Date  . URI (upper respiratory infection)     SURGICAL HISTORY: Past Surgical History:  Procedure Laterality Date  . ABDOMINAL  HYSTERECTOMY    . HIP SURGERY    . ORIF FEMUR FRACTURE Right 05/11/2019   Procedure: OPEN REDUCTION INTERNAL FIXATION (ORIF) DISTAL FEMUR FRACTURE;  Surgeon: Shona Needles, MD;  Location: [facility];  Service: Orthopedics;  Laterality: Right;    SOCIAL HISTORY: Social History   Socioeconomic History  . Marital status: Widowed    Spouse name: Not on file  . Number of children: Not on file  . Years of education: Not on file  . Highest education level: Not on file  Occupational History  . Not on file  Tobacco Use  . Smoking status: Never Smoker  . Smokeless tobacco: Never Used  Substance and Sexual Activity  . Alcohol use: Never  . Drug use: Never  . Sexual activity: Not on file  Other Topics Concern  . Not on file  Social History Narrative  . Not on file   Social Determinants of Health   Financial Resource Strain:   . Difficulty of Paying Living Expenses:   Food Insecurity:   . Worried About Charity fundraiser in the Last Year:   . [place] in the Last Year:   Transportation Needs:   . Lack of  Transportation (Medical):   Marland Kitchen Lack of Transportation (Non-Medical):   Physical Activity:   . Days of Exercise per Week:   . Minutes of Exercise per Session:   Stress:   . Feeling of Stress :   Social Connections:   . Frequency of Communication with Friends and Family:   . Frequency of Social Gatherings with Friends and Family:   . Attends Religious Services:   . Active Member of Clubs or Organizations:   . Attends Archivist Meetings:   Marland Kitchen Marital Status:   Intimate Partner Violence:   . Fear of Current or Ex-Partner:   . Emotionally Abused:   Marland Kitchen Physically Abused:   . Sexually Abused:     FAMILY HISTORY: History reviewed. No pertinent family history.  ALLERGIES:  is allergic to codeine and penicillins.  MEDICATIONS:  Current Facility-Administered Medications  Medication Dose Route Frequency Provider Last Rate Last Admin  . acetaminophen (TYLENOL)  tablet 1,000 mg  1,000 mg Oral Q6H Delray Alt, PA-C   1,000 mg at 05/16/19 1113  . hydrALAZINE (APRESOLINE) injection 10 mg  10 mg Intravenous Q6H PRN Patrecia Pace A, PA-C      . magnesium hydroxide (MILK OF MAGNESIA) suspension 30 mL  30 mL Oral Daily PRN Delray Alt, PA-C      . methocarbamol (ROBAXIN) tablet 500 mg  500 mg Oral Q6H PRN Patrecia Pace A, PA-C   500 mg at 05/15/19 2022  . ondansetron (ZOFRAN) tablet 4 mg  4 mg Oral Q6H PRN Delray Alt, PA-C   4 mg at 05/10/19 1857   Or  . ondansetron (ZOFRAN) injection 4 mg  4 mg Intravenous Q6H PRN Patrecia Pace A, PA-C   4 mg at 05/16/19 7253  . oxyCODONE (Oxy IR/ROXICODONE) immediate release tablet 5-10 mg  5-10 mg Oral Q4H PRN Delray Alt, PA-C   10 mg at 05/15/19 2022  . polyethylene glycol (MIRALAX / GLYCOLAX) packet 17 g  17 g Oral Once Patrecia Pace A, PA-C      . polyethylene glycol (MIRALAX / GLYCOLAX) packet 17 g  17 g Oral BID PRN Delray Alt, PA-C   17 g at 05/12/19 1829  . senna (SENOKOT) tablet 8.6 mg  1 tablet Oral BID Patrecia Pace A, PA-C   8.6 mg at 05/16/19 0945  . sodium chloride flush (NS) 0.9 % injection 3 mL  3 mL Intravenous Q12H Patrecia Pace A, PA-C   3 mL at 05/16/19 0945  . sodium phosphate (FLEET) 7-19 GM/118ML enema 1 enema  1 enema Rectal Once PRN Delray Alt, PA-C      . sorbitol 70 % solution 30 mL  30 mL Oral Daily PRN Delray Alt, PA-C        PHYSICAL EXAMINATION:  Vitals:   05/16/19 0247 05/16/19 0720  BP: 111/60 119/67  Pulse: 63 80  Resp: 17 18  Temp: 98.2 F (36.8 C) 98.4 F (36.9 C)  SpO2: 92% 91%   Filed Weights   05/10/19 1416 05/11/19 1115  Weight: 164 lb (74.4 kg) 163 lb (73.9 kg)    GENERAL: alert, no distress and comfortable SKIN: skin color, texture, turgor are normal, no rashes or significant lesions EYES: conjunctiva are pink and non-injected, sclera clear OROPHARYNX: no exudate, no erythema; lips, buccal mucosa, and tongue normal  NECK: supple,  non-tender LUNGS: clear to auscultation with normal breathing effort HEART: regular rate & rhythm, no murmurs, no lower extremity edema ABDOMEN: soft, non-tender,  non-distended, normal bowel sounds PSYCH: alert & oriented x 3, fluent speech  LABORATORY DATA:  I have reviewed the data as listed Lab Results  Component Value Date   WBC 7.7 05/16/2019   HGB 9.3 (L) 05/16/2019   HCT 27.7 (L) 05/16/2019   MCV 92.0 05/16/2019   PLT 146 (L) 05/16/2019   Lab Results  Component Value Date   NA 139 05/13/2019   K 4.1 05/13/2019   CL 101 05/13/2019   CO2 29 05/13/2019    RADIOGRAPHIC STUDIES: I have personally reviewed the radiological images as listed and agreed with the findings in the report. DG Chest 1 View  Result Date: 05/10/2019 CLINICAL DATA:  Preop for femur surgery. EXAM: CHEST  1 VIEW COMPARISON:  None. FINDINGS: The heart size and mediastinal contours are within normal limits. Both lungs are clear. The visualized skeletal structures are unremarkable. IMPRESSION: No active disease. Electronically Signed   By: Marijo Conception M.D.   On: 05/10/2019 15:26   DG Shoulder Right  Result Date: 05/16/2019 CLINICAL DATA:  Pain and bruising and immobility of the right shoulder for approximately 6 weeks. No known injury. EXAM: RIGHT SHOULDER - 2+ VIEW COMPARISON:  05/11/2019 humerus films. FINDINGS: The glenohumeral joint is maintained. No fracture of the humeral head or neck. There is a mildly displaced fracture through the base of the acromion. The [facility] joint appears to be intact. No definite clavicle fracture. IMPRESSION: Mildly displaced fracture through the base of the acromion. Electronically Signed   By: Marijo Sanes M.D.   On: 05/16/2019 09:45   CT CHEST W CONTRAST  Result Date: 05/12/2019 CLINICAL DATA:  Metastatic disease, staging and assessment for primary. EXAM: CT CHEST, ABDOMEN, AND PELVIS WITH CONTRAST TECHNIQUE: Multidetector CT imaging of the chest, abdomen and pelvis was  performed following the standard protocol during bolus administration of intravenous contrast. CONTRAST:  135m OMNIPAQUE IOHEXOL 300 MG/ML  SOLN COMPARISON:  Chest radiograph 04/12/2019 FINDINGS: CT CHEST FINDINGS Cardiovascular: Atherosclerotic calcification of the aortic arch. Mediastinum/Nodes: No pathologic thoracic adenopathy is observed. Lungs/Pleura: Likely subpleural lymph node along the minor fissure measuring 0.7 by 0.6 by 0.2 cm shown on image 70/6. 2 mm right middle lobe nodule on image 90/4. Mild dependent atelectasis in the posterior basal segment right lower lobe. Trace adjacent right pleural effusion. 3 mm right lower lobe nodule, image 97/4. 3 mm lingular nodule, image 92/4. 0.4 by 0.3 cm left lower lobe subpleural nodule, image 111/4. 0.5 by 0.4 cm left lower lobe nodule, image 103/4. Musculoskeletal: 6.4 by 4.9 by 5.8 cm (volume = 95 cm^3) enhancing mass in the right upper breast extending to the cutaneous surface. This abuts the superficial fascia margin of the pectoralis major muscle but I do not observe definite intramuscular invasion. Scattered osseous metastatic disease. Pathologic fracture of the base of the right acromion, image 6/3. Likely pathologic fracture of the inferior tip of the left scapula with surrounding osteoid formation, image 29/3. Numerous scattered bilateral rib lesions, many with pathologic fractures. Permeative mass involving the sternal manubrium. Multilevel vertebral involvement most notable at T9, and T7. Suspected pathologic fracture of the T7 spinous process. CT ABDOMEN PELVIS FINDINGS Hepatobiliary: Indistinct scattered metastatic lesions are present throughout the liver. Index segment 2 lesion 2.2 by 2.0 cm on image 46/3. Index right hepatic lobe lesion 2.9 by 2.6 cm on image 60/3. Multiple additional scattered lesions compatible with metastatic disease observed. Contracted gallbladder containing gallstones. Mild intrahepatic and extrahepatic biliary dilatation,  cause uncertain. Pancreas: Unremarkable  Spleen: Unremarkable Adrenals/Urinary Tract: Two nonobstructive left kidney lower pole calculi are present, one measuring 1.1 cm in long axis in the other measuring the same. 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 The ureters and urinary bladder partially obscured by streak artifact from the patient's right hip implant. There appears to be gas anteriorly in the urinary bladder, query recent catheterization. Left mid kidney cyst posteriorly. Stomach/Bowel: Sigmoid colon diverticulosis. Scattered diverticula of the descending colon. Vascular/Lymphatic: Aortoiliac atherosclerotic vascular disease. No pathologic adenopathy identified. Reproductive: Uterus absent.  Adnexa unremarkable. Other: Small locules of gas in the subcutaneous tissues of the right lower quadrant abdomen, possibly injection related. Musculoskeletal: Right total hip prosthesis. Permeative lesion in the right iliac crest with suspected nondisplaced pathologic fracture extending vertically in the right iliac bone. Lytic lesions in the left iliac crest and anterior superior left iliac spine. Primarily lytic lesions in the left upper sacrum. Primarily lytic lesions in the lumbar spine, including for example the right transverse process of L5. Grade 1 degenerative retrolisthesis at T11-12, T12-L1, and L1-2 with mild grade 1 anterolisthesis at L3-4 and L4-5. IMPRESSION: 1. Large (95 cubic cm) right upper breast mass likely representing recurrent breast cancer. Extensive scattered metastatic lesions throughout the skeleton, with various pathologic fractures primarily involving the ribs and bilateral scapula as well as the right iliac bone. 2. Scattered masses in the liver are most compatible with metastatic disease. 3. There are a few scattered tiny pulmonary nodules which are nonspecific. 4. Contracted gallbladder containing gallstones. Mild intrahepatic and  extrahepatic biliary dilatation, cause uncertain. 5. Nonobstructive left nephrolithiasis. 6. 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 7. Other imaging findings of potential clinical significance: Aortic Atherosclerosis (ICD10-I70.0). Sigmoid colon diverticulosis. Multilevel lumbar spondylosis and degenerative disc disease. Trace right pleural effusion. Electronically Signed   By: Van Clines M.D.   On: 05/12/2019 15:35   MR BRAIN WO CONTRAST  Result Date: 05/04/2019 CLINICAL DATA:  Metastatic disease evaluation. Additional history provided by technologist: Patient reports pain in legs and arms over 2 weeks, trouble walking, breast lump, trouble walking due to leg pain. EXAM: MRI HEAD WITHOUT CONTRAST TECHNIQUE: Multiplanar, multiecho pulse sequences of the brain and surrounding structures were obtained without intravenous contrast. COMPARISON:  No pertinent prior studies available for comparison. FINDINGS: Brain: Please note evaluation for intracranial metastatic disease is limited on this non-contrast examination. There is mild motion degradation of the axial diffusion-weighted imaging. No evidence of acute infarct. No evidence of intracranial mass. No midline shift or extra-axial fluid collection. No chronic intracranial blood products. Mild scattered T2/FLAIR hyperintensity within the cerebral white matter is nonspecific, but consistent with chronic small vessel ischemic disease. Mild generalized parenchymal atrophy. Vascular: Flow voids maintained within the proximal large arterial vessels. Skull and upper cervical spine: There is a 13 mm focus of T2 hyperintensity and corresponding diffusion weighted hyperintensity within the right frontoparietal calvarium (series 11, image 31) (series 7, image 83). There is abnormal T1 hypointense signal within portions of the upper cervical spine with relative sparing of the C4 vertebral body. Sinuses/Orbits: Visualized orbits demonstrate no  acute abnormality. Mild ethmoid sinus mucosal thickening. No significant mastoid effusion. IMPRESSION: 13 mm focus of T2 and diffusion-weighted signal hyperintensity within the right frontoparietal calvarium. There is also abnormal T1 hypointense marrow signal within portions of the visualized upper cervical spine with relative sparing of the C3 vertebra. Findings are indeterminate and osseous metastatic disease cannot be excluded. Consider nonemergent postcontrast MR  imaging of the head and contrast-enhanced MRI of the cervical spine for further evaluation. Within the limitations of a noncontrast study, there is no evidence of metastatic disease within the intracranial compartment. No evidence of acute intracranial abnormality. Mild generalized parenchymal atrophy and chronic small vessel ischemic disease. Electronically Signed   By: Kellie Simmering DO   On: 05/04/2019 14:58   MR BRAIN W WO CONTRAST  Result Date: 05/14/2019 CLINICAL DATA:  Rule out metastatic disease.  Right breast lump. EXAM: MRI HEAD WITHOUT AND WITH CONTRAST TECHNIQUE: Multiplanar, multiecho pulse sequences of the brain and surrounding structures were obtained without and with intravenous contrast. CONTRAST:  7.42m GADAVIST GADOBUTROL 1 MMOL/ML IV SOLN COMPARISON:  MRI head without contrast 05/04/2019 FINDINGS: Brain: Ventricle size normal. Mild cortical atrophy. Negative for acute infarct. Small white matter hyperintensities bilaterally consistent with chronic microvascular ischemia. Negative for hemorrhage or mass. Postcontrast imaging demonstrates no enhancing metastatic deposits in the brain. Vascular: Normal arterial flow voids Skull and upper cervical spine: 12 mm enhancing lesion right frontal bone. Infiltrative bone marrow lesions at C2 and C4. These findings are compatible with skeletal metastatic disease. Sinuses/Orbits: Mild mucosal edema paranasal sinuses. Bilateral cataract extraction Other: None IMPRESSION: Negative for metastatic  disease to the brain Skeletal metastatic disease right frontal bone, C2, C4 vertebral bodies. Electronically Signed   By: CFranchot GalloM.D.   On: 05/14/2019 10:32   CT ABDOMEN PELVIS W CONTRAST  Result Date: 05/12/2019 CLINICAL DATA:  Metastatic disease, staging and assessment for primary. EXAM: CT CHEST, ABDOMEN, AND PELVIS WITH CONTRAST TECHNIQUE: Multidetector CT imaging of the chest, abdomen and pelvis was performed following the standard protocol during bolus administration of intravenous contrast. CONTRAST:  1053mOMNIPAQUE IOHEXOL 300 MG/ML  SOLN COMPARISON:  Chest radiograph 04/12/2019 FINDINGS: CT CHEST FINDINGS Cardiovascular: Atherosclerotic calcification of the aortic arch. Mediastinum/Nodes: No pathologic thoracic adenopathy is observed. Lungs/Pleura: Likely subpleural lymph node along the minor fissure measuring 0.7 by 0.6 by 0.2 cm shown on image 70/6. 2 mm right middle lobe nodule on image 90/4. Mild dependent atelectasis in the posterior basal segment right lower lobe. Trace adjacent right pleural effusion. 3 mm right lower lobe nodule, image 97/4. 3 mm lingular nodule, image 92/4. 0.4 by 0.3 cm left lower lobe subpleural nodule, image 111/4. 0.5 by 0.4 cm left lower lobe nodule, image 103/4. Musculoskeletal: 6.4 by 4.9 by 5.8 cm (volume = 95 cm^3) enhancing mass in the right upper breast extending to the cutaneous surface. This abuts the superficial fascia margin of the pectoralis major muscle but I do not observe definite intramuscular invasion. Scattered osseous metastatic disease. Pathologic fracture of the base of the right acromion, image 6/3. Likely pathologic fracture of the inferior tip of the left scapula with surrounding osteoid formation, image 29/3. Numerous scattered bilateral rib lesions, many with pathologic fractures. Permeative mass involving the sternal manubrium. Multilevel vertebral involvement most notable at T9, and T7. Suspected pathologic fracture of the T7 spinous  process. CT ABDOMEN PELVIS FINDINGS Hepatobiliary: Indistinct scattered metastatic lesions are present throughout the liver. Index segment 2 lesion 2.2 by 2.0 cm on image 46/3. Index right hepatic lobe lesion 2.9 by 2.6 cm on image 60/3. Multiple additional scattered lesions compatible with metastatic disease observed. Contracted gallbladder containing gallstones. Mild intrahepatic and extrahepatic biliary dilatation, cause uncertain. Pancreas: Unremarkable Spleen: Unremarkable Adrenals/Urinary Tract: Two nonobstructive left kidney lower pole calculi are present, one measuring 1.1 cm in long axis in the other measuring the same. There is at least partial  duplication of both right and left renal collecting systems. The ureters and urinary bladder partially obscured by streak artifact from the patient's right hip implant. There appears to be gas anteriorly in the urinary bladder, query recent catheterization. Left mid kidney cyst posteriorly. Stomach/Bowel: Sigmoid colon diverticulosis. Scattered diverticula of the descending colon. Vascular/Lymphatic: Aortoiliac atherosclerotic vascular disease. No pathologic adenopathy identified. Reproductive: Uterus absent.  Adnexa unremarkable. Other: Small locules of gas in the subcutaneous tissues of the right lower quadrant abdomen, possibly injection related. Musculoskeletal: Right total hip prosthesis. Permeative lesion in the right iliac crest with suspected nondisplaced pathologic fracture extending vertically in the right iliac bone. Lytic lesions in the left iliac crest and anterior superior left iliac spine. Primarily lytic lesions in the left upper sacrum. Primarily lytic lesions in the lumbar spine, including for example the right transverse process of L5. Grade 1 degenerative retrolisthesis at T11-12, T12-L1, and L1-2 with mild grade 1 anterolisthesis at L3-4 and L4-5. IMPRESSION: 1. Large (95 cubic cm) right upper breast mass likely representing recurrent breast  cancer. Extensive scattered metastatic lesions throughout the skeleton, with various pathologic fractures primarily involving the ribs and bilateral scapula as well as the right iliac bone. 2. Scattered masses in the liver are most compatible with metastatic disease. 3. There are a few scattered tiny pulmonary nodules which are nonspecific. 4. Contracted gallbladder containing gallstones. Mild intrahepatic and extrahepatic biliary dilatation, cause uncertain. 5. Nonobstructive left nephrolithiasis. 6. 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 7. Other imaging findings of potential clinical significance: Aortic Atherosclerosis (ICD10-I70.0). Sigmoid colon diverticulosis. Multilevel lumbar spondylosis and degenerative disc disease. Trace right pleural effusion. Electronically Signed   By: Van Clines M.D.   On: 05/12/2019 15:35   CT FEMUR LEFT W CONTRAST  Result Date: 05/12/2019 CLINICAL DATA:  Pathologic right femur fracture. Abnormal left femur x-ray EXAM: CT OF THE LOWER LEFT EXTREMITY WITH CONTRAST TECHNIQUE: Multidetector CT imaging of the lower left extremity was performed according to the standard protocol following intravenous contrast administration. COMPARISON:  Left femur x-ray 05/11/2019 CONTRAST:  129m OMNIPAQUE IOHEXOL 300 MG/ML  SOLN FINDINGS: Bones/Joint/Cartilage At the cranial most axial image, there is partial visualization of a healing fracture involving the anterior left iliac crest (series 3, image 1). The bone at the fracture site appears lucent and irregular which could reflect an underlying osseous lesion versus changes related to fracture. Eccentrically located lytic bone lesion within the anterolateral aspect of the subtrochanteric proximal left femur with endosteal scalloping and cortical breakthrough. Lesion measures approximately 3.5 x 2.8 cm (series 7, images 65-66; series 3, images 88-96). It is difficult to accurately assess the percentage of the  medullary space occupied by this lesion on CT imaging. Suspect additional lesion within the distal femoral diaphysis where there is subtle endosteal scalloping of the lateral cortex and increased density within the marrow space measuring approximately 2.5 cm (series 7, image 66). Alignment at the hip and knee are maintained without dislocation. Ligaments Suboptimally assessed by CT. Muscles and Tendons No myotendinous abnormality is seen. Soft tissues No well-defined soft tissue mass. No fluid collection or hematoma. No inguinal lymphadenopathy. Sigmoid diverticulosis. IMPRESSION: 1. Eccentrically located lytic bone lesion within the anterolateral aspect of the subtrochanteric proximal left femur with endosteal scalloping and cortical breakthrough. Lesion is at risk for pathologic fracture. 2. Suspect additional lesion within the distal femoral diaphysis where there is subtle endosteal scalloping of the lateral cortex and increased density within the marrow space. These findings are suspicious for  metastatic disease. 3. Partial visualization of a healing fracture involving the anterior left iliac crest. The bone at the fracture site appears lucent and irregular concerning for an underlying bone lesion. Electronically Signed   By: Davina Poke D.O.   On: 05/12/2019 15:22   DG Knee Right Port  Result Date: 05/11/2019 CLINICAL DATA:  Fracture, postop. EXAM: PORTABLE RIGHT KNEE - 1-2 VIEW COMPARISON:  Preoperative radiograph 05/10/2019. Concurrent femur radiograph, reported separately. FINDINGS: Lateral plate and multi screw fixation of displaced distal femoral shaft fracture. Fracture is in improved alignment compared to preoperative imaging. Proximal aspect of the hardware is included on concurrent femur exam. Recent postsurgical change includes air and edema in the soft tissues. IMPRESSION: Lateral plate and screw fixation of displaced distal femoral shaft fracture, improved alignment compared to preoperative  imaging. No medial postoperative complication. Electronically Signed   By: Keith Rake M.D.   On: 05/11/2019 15:49   DG Humerus Left  Result Date: 05/11/2019 CLINICAL DATA:  Fracture. EXAM: LEFT HUMERUS - 2+ VIEW COMPARISON:  None. FINDINGS: Cortical margins of the humerus are intact. There is no evidence of fracture or other focal bone lesions. Shoulder and elbow alignment grossly maintained. Soft tissues are unremarkable. Blood pressure cuff overlies the arm and IV in the antecubital fossa. IMPRESSION: Negative radiographs of the left humerus. Electronically Signed   By: Keith Rake M.D.   On: 05/11/2019 15:50   DG Humerus Right  Result Date: 05/11/2019 CLINICAL DATA:  Fracture. EXAM: RIGHT HUMERUS - 2+ VIEW COMPARISON:  None. FINDINGS: Cortical margins of the humerus are intact. There is no evidence of fracture or other focal bone lesions. Shoulder and elbow alignment are grossly maintained. Soft tissues are unremarkable. IMPRESSION: Negative radiographs of the right humerus. Electronically Signed   By: Keith Rake M.D.   On: 05/11/2019 15:52   DG C-Arm 1-60 Min  Result Date: 05/13/2019 CLINICAL DATA:  Intramedullary nail EXAM: LEFT FEMUR 2 VIEWS; DG C-ARM 1-60 MIN COMPARISON:  Radiography from 2 days ago FINDINGS: Multiple fluoroscopic images shows nail fixation of the left femur. By CT, a bone lesion is present proximally in the femur, not depicted on this study. IMPRESSION: Fluoroscopy for femoral fixation. Electronically Signed   By: Monte Fantasia M.D.   On: 05/13/2019 09:30   DG C-Arm 1-60 Min  Result Date: 05/11/2019 CLINICAL DATA:  ORIF of right femur. FLUOROSCOPY TIME:  1 minutes 58 seconds. Images: 10 EXAM: RIGHT FEMUR 2 VIEWS COMPARISON:  None. FINDINGS: A displaced distal femoral diaphysis fracture seen at the beginning of the study. Throughout the study, the fracture was reduced and a sideplate was affixed to the distal femur, crossing the fracture. The patient is  status post right hip replacement as well. Only the distal femoral stem is visualized of the right hip replacement. IMPRESSION: Right distal femoral fracture repair as above. Electronically Signed   By: Dorise Bullion III M.D   On: 05/11/2019 16:01   DG HIP UNILAT WITH PELVIS 2-3 VIEWS RIGHT  Result Date: 05/10/2019 CLINICAL DATA:  Right leg injury. EXAM: DG HIP (WITH OR WITHOUT PELVIS) 2-3V RIGHT COMPARISON:  None. FINDINGS: Status post right total hip arthroplasty. The right acetabular and femoral components appear to be well situated. No fracture or dislocation is seen involving the visualized skeleton. IMPRESSION: No acute abnormality seen in the right hip. Status post right total hip arthroplasty. Electronically Signed   By: Marijo Conception M.D.   On: 05/10/2019 15:26   DG FEMUR MIN  2 VIEWS LEFT  Result Date: 05/13/2019 CLINICAL DATA:  Intramedullary nail EXAM: LEFT FEMUR 2 VIEWS; DG C-ARM 1-60 MIN COMPARISON:  Radiography from 2 days ago FINDINGS: Multiple fluoroscopic images shows nail fixation of the left femur. By CT, a bone lesion is present proximally in the femur, not depicted on this study. IMPRESSION: Fluoroscopy for femoral fixation. Electronically Signed   By: Monte Fantasia M.D.   On: 05/13/2019 09:30   DG FEMUR, MIN 2 VIEWS RIGHT  Result Date: 05/11/2019 CLINICAL DATA:  ORIF of right femur. FLUOROSCOPY TIME:  1 minutes 58 seconds. Images: 10 EXAM: RIGHT FEMUR 2 VIEWS COMPARISON:  None. FINDINGS: A displaced distal femoral diaphysis fracture seen at the beginning of the study. Throughout the study, the fracture was reduced and a sideplate was affixed to the distal femur, crossing the fracture. The patient is status post right hip replacement as well. Only the distal femoral stem is visualized of the right hip replacement. IMPRESSION: Right distal femoral fracture repair as above. Electronically Signed   By: Dorise Bullion III M.D   On: 05/11/2019 15:59   DG FEMUR, MIN 2 VIEWS  RIGHT  Result Date: 05/11/2019 CLINICAL DATA:  Right femur fracture, postop. EXAM: RIGHT FEMUR 2 VIEWS COMPARISON:  Prior operative radiograph yesterday. FINDINGS: Lateral plate and multi screw fixation of distal femoral shaft fracture. Fracture is in improved alignment compared to preoperative imaging. Right hip arthroplasty appears intact. Recent postsurgical change includes air and edema in the soft tissues. IMPRESSION: Post ORIF distal right femur fracture in improved alignment compared to preoperative imaging. No immediate postoperative complications. Electronically Signed   By: Keith Rake M.D.   On: 05/11/2019 15:54   DG FEMUR, MIN 2 VIEWS RIGHT  Result Date: 05/10/2019 CLINICAL DATA:  Right leg injury. EXAM: RIGHT FEMUR 2 VIEWS COMPARISON:  None. FINDINGS: Severely displaced and angulated fracture is seen involving the distal right femoral shaft. Status post right total hip arthroplasty. No soft tissue abnormality is noted. IMPRESSION: Severely displaced and angulated distal right femoral shaft fracture. Electronically Signed   By: Marijo Conception M.D.   On: 05/10/2019 15:24   DG FEMUR PORT MIN 2 VIEWS LEFT  Result Date: 05/13/2019 CLINICAL DATA:  Femoral nail fixation EXAM: LEFT FEMUR PORTABLE 2 VIEWS COMPARISON:  Fluoroscopy earlier today FINDINGS: Intramedullary femoral nail in expected location, as are interlocking screws proximally and distally. No complicating fracture. IMPRESSION: No unexpected finding after femoral fixation. Electronically Signed   By: Monte Fantasia M.D.   On: 05/13/2019 10:41   DG FEMUR PORT MIN 2 VIEWS LEFT  Result Date: 05/11/2019 CLINICAL DATA:  Fracture. Recent right femur fracture post surgical fixation. EXAM: LEFT FEMUR PORTABLE 2 VIEWS COMPARISON:  None. FINDINGS: No evidence of left femur fracture. There is questionable cortical thinning involving the proximal femur in the subtrochanteric region laterally with decreased bone density. Hip and knee  alignment are grossly maintained. No soft tissue abnormality. IMPRESSION: 1. No evidence of left femur fracture. 2. Questionable cortical thinning involving the proximal femur in the subtrochanteric region laterally. Findings are suspicious for underlying bone lesion, recommend further evaluation with MRI when patient is able. Electronically Signed   By: Keith Rake M.D.   On: 05/11/2019 16:31    PATHOLOGY: I have reviewed the pathology reports as documented in the oncologist history.

## 2019-05-16 NOTE — Progress Notes (Signed)
Orthopedic Tech Progress Note Patient Details:  Yesenia Sullivan [birth year] [id number] Patient did not want to wear the [facility], said it would not help. And asked me to put the [facility] on the counter. Notified RN Ortho Devices Type of Ortho Device: Shoulder immobilizer Ortho Device/Splint Location: RUE Ortho Device/Splint Interventions: Other (comment)   Post Interventions Patient Tolerated: Other (comment) Instructions Provided: Care of device, Adjustment of device   Janit Pagan 05/16/2019, 1:32 PM

## 2019-05-16 NOTE — Progress Notes (Signed)
PROGRESS NOTE    Yesenia Sullivan  [id number] DOB: [birth year] DOA: 05/10/2019 PCP: Patient, No Pcp Per   Brief Narrative:  Patient is a 4 female with no significant past medical history who presented to the emergency department on 3/23 with complaints of left lower extremity pain, swelling.   She also reported noticing of right breast lump about a year ago. On 3/17, she had presented to the emergency department, at the time MRI of the brain showed findings suspicious for osseous metastatic lesion. She wanted to be discharged and follow as an outpatient She has been seen by her physician on 3/23 and has been referred to breast clinic for further evaluation. On the way out of her PCPs office, she suddenly felt a pop on her right thigh followed by severe pain and obvious deformity.  X-ray done in the emergency room showed right femur fracture which was severely displaced and angulated. CT scan of the left femur showed lytic lesion in the peritrochanteric region with more than one third of the femur involved. Orthopedics consulted.  Underwent nailing of left femur on 05/13/2019.  She is waiting for PT/OT evaluation Initiated breast cancer work-up.  Assessment & Plan:   Active Problems:   Pathological fracture of shaft of right femur (HCC)   Metastatic cancer (HCC)   Lytic bone lesion of left femur   Right closed femur fracture: Likely pathological fracture. Imaging findings as above.Underwent  nailing of the left femur. Continue pain management.  PT/OT evaluation pending.  Right shoulder pain: Patient's right shoulder was found to be significantly edematous/tender.  She was also complaining of pain.  Right x-ray showed mildly displaced fracture through the base of the acromion.  Orthopedics will follow.  No plan for intervention  Elevated blood pressure: Most likely secondary to pain. Continue to monitor blood pressure. Continue as needed medication.No H/O HTN  Right breast mass with  evidence of  metastasis: She had felt right breast lump a year ago but did not want to seek medical attention. She has been followed by her PCP and has been referred to breast clinic. MRI of the brain done on 3/17 due to pain on extremities, trouble walking. It showed 13 mm focus of T2 and diffusion-weighted signal hyperintensity in the right frontoparietal area, suspicious for metastatic disease. Case has been discussed with Dr.Zhao, oncology.CT chest/abdomen/pelvis with contrast done here showed large left-sided breast mass, diffuse metastatic skeletal lesions with pathological fractures of the ribs, hepatic metastasis. Plan for liver biopsy for diagnosis today MRI of the brain done on this admission showed no metastatic lesions but showed skeletal metastatic disease right frontal bone, C2, C4 vertebral bodies. Patient is very confused about whether to go or not for treatment for her possible metastatic breast cancer.  She is not that interested for further work-up.  I have requested for palliative care evaluation for discussion of goals of care.  I think she is a candidate for hospice at home.  Constipation: Continue bowel regimen         DVT prophylaxis:Lovenox Code Status: Full code Family Communication: Patient understands the plan. Discussed with Brother at bed side on 05/15/19 Disposition Plan: Patient is from home. Likely needs rehab/nursing facility with femur fracture. Waiting for PT/OT evaluation.  Also waiting for cancer work-up   Consultants: Orthopedics, oncology  Procedures: Nailing of right femur  Antimicrobials:  Anti-infectives (From admission, onward)   Start     Dose/Rate Route Frequency Ordered Stop   05/13/19 2000  ceFAZolin (ANCEF) IVPB  2g/100 mL premix     2 g 200 mL/hr over 30 Minutes Intravenous Every 12 hours 05/13/19 1117 05/15/19 1444   05/11/19 2000  ceFAZolin (ANCEF) IVPB 2g/100 mL premix     2 g 200 mL/hr over 30 Minutes Intravenous Every 8 hours  05/11/19 1555 05/12/19 1300   05/11/19 1345  vancomycin (VANCOCIN) powder  Status:  Discontinued       As needed 05/11/19 1346 05/11/19 1419   05/11/19 1159  ceFAZolin (ANCEF) 2-4 GM/100ML-% IVPB    Note to Pharmacy: [name]   : cabinet override      05/11/19 1159 05/11/19 2206      Subjective: Patient seen and examined at the bedside this morning.  Complains of  generalized body ache today.  Also complaining of bruise on her right arm and pain in her right shoulder.  She was still wondering if she needs to go for treatment of her breast cancer.  I explained that we need to get the tissue biopsy first before thinking about it.  I have also told her that I will be going to consult palliative care for discussion of goals of care   Objective: Vitals:   05/15/19 1415 05/15/19 1931 05/16/19 0247 05/16/19 0720  BP: 132/69 111/72 111/60 119/67  Pulse: 85 84 63 80  Resp: 18 17 17 18   Temp: 99 F (37.2 C) 99 F (37.2 C) 98.2 F (36.8 C) 98.4 F (36.9 C)  TempSrc: Oral Oral Oral Oral  SpO2: 92% 95% 92% 91%  Weight:      Height:        Intake/Output Summary (Last 24 hours) at 05/16/2019 1204 Last data filed at 05/16/2019 0859 Gross per 24 hour  Intake 240 ml  Output --  Net 240 ml   Filed Weights   05/10/19 1416 05/11/19 1115  Weight: 74.4 kg 73.9 kg    Examination:    General exam: Anxious, complaining of generalized body ache Respiratory system:  no wheezes or crackles  Cardiovascular system: S1 & S2 heard, RRR. No JVD, murmurs, rubs, gallops or clicks. Gastrointestinal system: Abdomen is nondistended, soft and nontender. No organomegaly or masses felt. Normal bowel sounds heard. Central nervous system: Alert and oriented. No focal neurological deficits. Extremities: No edema, no clubbing ,no cyanosis, clean surgical wounds on the right thigh, enlarged/tender right shoulder  skin: Ecchymotic rash on the right arm    Data Reviewed: I have personally reviewed following  labs and imaging studies  CBC: Recent Labs  Lab 05/10/19 1530 05/11/19 0333 05/12/19 0433 05/13/19 0406 05/14/19 0418 05/15/19 0626 05/16/19 0502  WBC 11.2*   < > 8.7 7.9 6.6 7.3 7.7  NEUTROABS 10.1*  --   --   --   --   --   --   HGB 12.6   < > 10.6* 9.8* 9.7* 9.6* 9.3*  HCT 38.2   < > 32.1* 30.8* 30.3* 30.0* 27.7*  MCV 90.7   < > 91.7 93.1 93.2 91.5 92.0  PLT 180   < > 150 139* 121* 132* 146*   < > = values in this interval not displayed.   Basic Metabolic Panel: Recent Labs  Lab 05/10/19 1530 05/11/19 0333 05/12/19 0433 05/13/19 0406  NA 139 139 138 139  K 3.9 4.0 5.0 4.1  CL 102 104 101 101  CO2 25 26 27 29   GLUCOSE 130* 91 108* 91  BUN 27* 31* 20 20  CREATININE 0.74 0.96 0.90 1.06*  CALCIUM 9.1  9.0 8.6* 8.9   GFR: Estimated Creatinine Clearance: 44.5 mL/min (A) (by C-G formula based on SCr of 1.06 mg/dL (H)). Liver Function Tests: Recent Labs  Lab 05/10/19 1530 05/12/19 0939  AST 51* 93*  ALT 20 68*  ALKPHOS 168* 161*  BILITOT 0.6 0.5  PROT 7.6 6.3*  ALBUMIN 4.4 3.3*   No results for input(s): LIPASE, AMYLASE in the last 168 hours. No results for input(s): AMMONIA in the last 168 hours. Coagulation Profile: Recent Labs  Lab 05/15/19 0626  INR 1.1   Cardiac Enzymes: No results for input(s): CKTOTAL, CKMB, CKMBINDEX, TROPONINI in the last 168 hours. BNP (last 3 results) No results for input(s): PROBNP in the last 8760 hours. HbA1C: No results for input(s): HGBA1C in the last 72 hours. CBG: No results for input(s): GLUCAP in the last 168 hours. Lipid Profile: No results for input(s): CHOL, HDL, LDLCALC, TRIG, CHOLHDL, LDLDIRECT in the last 72 hours. Thyroid Function Tests: No results for input(s): TSH, T4TOTAL, FREET4, T3FREE, THYROIDAB in the last 72 hours. Anemia Panel: No results for input(s): VITAMINB12, FOLATE, FERRITIN, TIBC, IRON, RETICCTPCT in the last 72 hours. Sepsis Labs: No results for input(s): PROCALCITON, LATICACIDVEN in the  last 168 hours.  Recent Results (from the past 240 hour(s))  Respiratory Panel by RT PCR (Flu A&B, Covid) - Nasopharyngeal Swab     Status: None   Collection Time: 05/10/19  4:46 PM   Specimen: Nasopharyngeal Swab  Result Value Ref Range Status   SARS Coronavirus 2 by RT PCR NEGATIVE NEGATIVE Final    Comment: (NOTE) SARS-CoV-2 target nucleic acids are NOT DETECTED. The SARS-CoV-2 RNA is generally detectable in upper respiratoy specimens during the acute phase of infection. The lowest concentration of SARS-CoV-2 viral copies this assay can detect is 131 copies/mL. A negative result does not preclude SARS-Cov-2 infection and should not be used as the sole basis for treatment or other patient management decisions. A negative result may occur with  improper specimen collection/handling, submission of specimen other than nasopharyngeal swab, presence of viral mutation(s) within the areas targeted by this assay, and inadequate number of viral copies (<131 copies/mL). A negative result must be combined with clinical observations, patient history, and epidemiological information. The expected result is Negative. Fact Sheet for Patients:  PinkCheek.be Fact Sheet for Healthcare Providers:  GravelBags.it This test is not yet ap proved or cleared by the [place] FDA and  has been authorized for detection and/or diagnosis of SARS-CoV-2 by FDA under an Emergency Use Authorization (EUA). This EUA will remain  in effect (meaning this test can be used) for the duration of the COVID-19 declaration under Section 564(b)(1) of the Act, 21 U.S.C. section 360bbb-3(b)(1), unless the authorization is terminated or revoked sooner.    Influenza A by PCR NEGATIVE NEGATIVE Final   Influenza B by PCR NEGATIVE NEGATIVE Final    Comment: (NOTE) The Xpert Xpress SARS-CoV-2/FLU/RSV assay is intended as an aid in  the diagnosis of influenza from  Nasopharyngeal swab specimens and  should not be used as a sole basis for treatment. Nasal washings and  aspirates are unacceptable for Xpert Xpress SARS-CoV-2/FLU/RSV  testing. Fact Sheet for Patients: PinkCheek.be Fact Sheet for Healthcare Providers: GravelBags.it This test is not yet approved or cleared by the [place] FDA and  has been authorized for detection and/or diagnosis of SARS-CoV-2 by  FDA under an Emergency Use Authorization (EUA). This EUA will remain  in effect (meaning this test can be used) for the duration  of the  Covid-19 declaration under Section 564(b)(1) of the Act, 21  U.S.C. section 360bbb-3(b)(1), unless the authorization is  terminated or revoked. Performed at [facility], [place] [address] [place], [place] [address]   Surgical pcr screen     Status: None   Collection Time: 05/13/19  4:18 AM   Specimen: Nasal Mucosa; Nasal Swab  Result Value Ref Range Status   MRSA, PCR NEGATIVE NEGATIVE Final   Staphylococcus aureus NEGATIVE NEGATIVE Final    Comment: (NOTE) The Xpert SA Assay (FDA approved for NASAL specimens in patients 75 years of age and older), is one component of a comprehensive surveillance program. It is not intended to diagnose infection nor to guide or monitor treatment. Performed at [facility], [place] [address], [place], [place] [address]          Radiology Studies: DG Shoulder Right  Result Date: 05/16/2019 CLINICAL DATA:  Pain and bruising and immobility of the right shoulder for approximately 6 weeks. No known injury. EXAM: RIGHT SHOULDER - 2+ VIEW COMPARISON:  05/11/2019 humerus films. FINDINGS: The glenohumeral joint is maintained. No fracture of the humeral head or neck. There is a mildly displaced fracture through the base of the acromion. The [facility] joint appears to be intact. No definite clavicle fracture. IMPRESSION: Mildly displaced fracture  through the base of the acromion. Electronically Signed   By: Marijo Sanes M.D.   On: 05/16/2019 09:45        Scheduled Meds: . acetaminophen  1,000 mg Oral Q6H  . polyethylene glycol  17 g Oral Once  . senna  1 tablet Oral BID  . sodium chloride flush  3 mL Intravenous Q12H   Continuous Infusions:    LOS: 6 days    Time spent:25 mins. More than 50% of that time was spent in counseling and/or coordination of care.      Shelly Coss, MD [facility] P3/29/2021, 12:04 PM

## 2019-05-16 NOTE — Progress Notes (Signed)
Physical Therapy Treatment Patient Details Name: Yesenia Sullivan MRN: [id number] DOB: [birth year] Today's Date: 05/16/2019    History of Present Illness 74 yo admitted as she felt her leg pop leaving doctors office with pathologic right femur fx s/p ORIF. 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 Pt noted breast lump a year ago but did not seek medical tx. MRI 3/17 with right frontoparietal hyperintensity suspiscious for metastasis. Lesion of left femur noted and elective Im nail of left femur performed 3/26. PMHx: Rt THA    PT Comments    Pt pleasant and willing to participate. [name] present and states she will be able to provide 24hr assist at D/C. Pt wanting to mobilize to bathroom but fatigued with increased distance and demonstrating difficulty maintaining weight bearing status with fatigue. Pt clearly stating desire to discuss with palliative and hospice if here CA diagnosis is confirmed and pt educated for role in goals, mobility and controlling level of mobility progression. Discussed with pt ultimately limiting mobility to just transfers with all staff other than therapy due to fatigue and limited tolerance with tDWB and painful bil UE. Pt voiced understanding and agreement. Will continue to follow.     Follow Up Recommendations  Home health PT;Supervision for mobility/OOB     Equipment Recommendations  3in1 (PT);Wheelchair (measurements PT);Wheelchair cushion (measurements PT)    Recommendations for Other Services       Precautions / Restrictions Precautions Precautions: Fall Restrictions Weight Bearing Restrictions: Yes RLE Weight Bearing: Touchdown weight bearing LLE Weight Bearing: Weight bearing as tolerated    Mobility  Bed Mobility Overal bed mobility: Modified Independent             General bed mobility comments: with rail, HOB 30 and increased time  Transfers Overall transfer level: Needs assistance Equipment used: Rolling walker  (2 wheeled) Transfers: Sit to/from Stand Sit to Stand: Min assist         General transfer comment: cues for hand placement, cues for WB status and to kick RLE out prior to sitting to ensure adherence to WB. Pt putting RLE down for transfers and reports TDWB but questionable  Ambulation/Gait Ambulation/Gait assistance: Min guard Gait Distance (Feet): 12 Feet Assistive device: Rolling walker (2 wheeled) Gait Pattern/deviations: Step-to pattern   Gait velocity interpretation: 1.31 - 2.62 ft/sec, indicative of limited community ambulator General Gait Details: cues for posture, sequence, safety and maintaining TDWB. Pt with tendency to put Rt foot down and needs mod cues to maintain TDWB. Pt fatigues quickly with support of bil UE due to pain. Pt walked 12'x 2   Stairs             Wheelchair Mobility    Modified Rankin (Stroke Patients Only)       Balance Overall balance assessment: Needs assistance Sitting-balance support: Feet unsupported;No upper extremity supported Sitting balance-Leahy Scale: Good     Standing balance support: Bilateral upper extremity supported;During functional activity Standing balance-Leahy Scale: Fair Standing balance comment: pt reports weakness in BUE (RUE weaker than LUE) heavy reliance on BUE support on RW, pt with decreased adherence to weight bearing precaution with ambulation;discussed limiting mobility to stand pivot to maximize adherence to WB status to ensure proper healing                            Cognition Arousal/Alertness: Awake/alert Behavior During Therapy: WFL for tasks assessed/performed Overall Cognitive Status: Within Functional Limits for tasks  assessed                                 General Comments: pt requesting to discuss with palliative      Exercises General Exercises - Lower Extremity Long Arc Quad: AROM;Seated;15 reps;Both Hip ABduction/ADduction: AROM;Both;10 reps;Seated Hip  Flexion/Marching: AROM;Seated;10 reps;Both;15 reps    General Comments General comments (skin integrity, edema, etc.): pt's friend present throughout session, she reports she is going to move in wtih the patient to provide as much care and assistance as needed      Pertinent Vitals/Pain Pain Assessment: 0-10 Pain Score: 7  Pain Location: Right shoulder Pain Descriptors / Indicators: Sore;Grimacing Pain Intervention(s): Limited activity within patient's tolerance;Monitored during session;Patient requesting pain meds-RN notified;Repositioned    Home Living                      Prior Function            PT Goals (current goals can now be found in the care plan section) Acute Rehab PT Goals Patient Stated Goal: return home Progress towards PT goals: Progressing toward goals    Frequency    Min 4X/week      PT Plan Current plan remains appropriate;Frequency needs to be updated    Co-evaluation   Reason for Co-Treatment: For patient/therapist safety;To address functional/ADL transfers   OT goals addressed during session: ADL's and self-care      AM-PAC PT "6 Clicks" Mobility   Outcome Measure  Help needed turning from your back to your side while in a flat bed without using bedrails?: A Little Help needed moving from lying on your back to sitting on the side of a flat bed without using bedrails?: A Little Help needed moving to and from a bed to a chair (including a wheelchair)?: A Little Help needed standing up from a chair using your arms (e.g., wheelchair or bedside chair)?: A Little Help needed to walk in hospital room?: A Little Help needed climbing 3-5 steps with a railing? : A Lot 6 Click Score: 17    End of Session Equipment Utilized During Treatment: Gait belt Activity Tolerance: Patient limited by fatigue Patient left: in chair;with call bell/phone within reach;with chair alarm set;with family/visitor present Nurse Communication: Mobility  status PT Visit Diagnosis: Other abnormalities of gait and mobility (R26.89);Difficulty in walking, not elsewhere classified (R26.2)     Time: [id number] PT Time Calculation (min) (ACUTE ONLY): 26 min  Charges:  $Therapeutic Activity: 8-22 mins                     Eion Timbrook P, PT Acute Rehabilitation Services Pager: [phone] Office: [place] 05/16/2019, 1:28 PM

## 2019-05-16 NOTE — Progress Notes (Signed)
Patient is complaining of significant pain from the right shoulder and bilateral knees,8 -9/10 on the pain scale. Right shoulder with increased ecchymosis from a medium red to now a maroon colored ecchymosis from shoulder to elbow. PRN pain meds given to patient as requested. Patient to discuss with Dr on rounds today.

## 2019-05-16 NOTE — Progress Notes (Signed)
Got a call from oncology, Dr. Salem Senate.  He said the bone biopsy showed metastatic breast cancer.  No need of liver biopsy.  Biopsy order canceled.

## 2019-05-16 NOTE — Progress Notes (Signed)
PT Cancellation Note  Patient Details Name: Yesenia Sullivan MRN: [id number] DOB: 08-Feb-1946   Cancelled Treatment:    Reason Eval/Treat Not Completed: Patient declined, no reason specified(pt refused stating nausea and fatigue, medication given by RN. Pt does state she believes home will be her plan)   [name] B [name] 05/16/2019, 8:18 AM  Bayard Males, PT Acute Rehabilitation Services Pager: [phone] Office: [phone]

## 2019-05-17 DIAGNOSIS — Z515 Encounter for palliative care: Secondary | ICD-10-CM

## 2019-05-17 DIAGNOSIS — R52 Pain, unspecified: Secondary | ICD-10-CM

## 2019-05-17 DIAGNOSIS — K769 Liver disease, unspecified: Secondary | ICD-10-CM

## 2019-05-17 DIAGNOSIS — Z7189 Other specified counseling: Secondary | ICD-10-CM

## 2019-05-17 DIAGNOSIS — Z170 Estrogen receptor positive status [ER+]: Secondary | ICD-10-CM

## 2019-05-17 DIAGNOSIS — C50811 Malignant neoplasm of overlapping sites of right female breast: Secondary | ICD-10-CM

## 2019-05-17 DIAGNOSIS — M899 Disorder of bone, unspecified: Secondary | ICD-10-CM

## 2019-05-17 DIAGNOSIS — M898X9 Other specified disorders of bone, unspecified site: Secondary | ICD-10-CM

## 2019-05-17 DIAGNOSIS — S72301A Unspecified fracture of shaft of right femur, initial encounter for closed fracture: Secondary | ICD-10-CM

## 2019-05-17 LAB — SURGICAL PATHOLOGY

## 2019-05-17 MED ORDER — SODIUM CHLORIDE 0.9 % IV SOLN
INTRAVENOUS | Status: DC | PRN
  Administered 2019-05-17: 250 mL via INTRAVENOUS

## 2019-05-17 MED ORDER — ZOLEDRONIC ACID 4 MG/5ML IV CONC
4.0000 mg | Freq: Once | INTRAVENOUS | Status: AC
  Administered 2019-05-17: 4 mg via INTRAVENOUS
  Filled 2019-05-17: qty 5

## 2019-05-17 MED ORDER — OXYCODONE HCL ER 15 MG PO T12A
15.0000 mg | EXTENDED_RELEASE_TABLET | Freq: Two times a day (BID) | ORAL | Status: DC
  Administered 2019-05-17 – 2019-05-18 (×3): 15 mg via ORAL
  Filled 2019-05-17 (×3): qty 1

## 2019-05-17 MED ORDER — DEXAMETHASONE 4 MG PO TABS
2.0000 mg | ORAL_TABLET | Freq: Two times a day (BID) | ORAL | Status: DC
  Administered 2019-05-17 – 2019-05-18 (×3): 2 mg via ORAL
  Filled 2019-05-17 (×3): qty 1

## 2019-05-17 NOTE — Progress Notes (Signed)
PROGRESS NOTE    Yesenia Sullivan  [id number] DOB: 1946/01/25 DOA: 05/10/2019 PCP: Patient, No Pcp Per   Brief Narrative:  Patient is a 74 female with no significant past medical history who presented to the emergency department on 3/23 with complaints of left lower extremity pain, swelling.   She also reported noticing of right breast lump about a year ago. On 3/17, she had presented to the emergency department, at the time MRI of the brain showed findings suspicious for osseous metastatic lesion. She wanted to be discharged and follow as an outpatient She has been seen by her physician on 3/23 and has been referred to breast clinic for further evaluation. On the way out of her PCPs office, she suddenly felt a pop on her right thigh followed by severe pain and obvious deformity.  X-ray done in the emergency room showed right femur fracture which was severely displaced and angulated. CT scan of the left femur showed lytic lesion in the peritrochanteric region with more than one third of the femur involved. Orthopedics consulted.  Underwent nailing of left femur on 05/13/2019.   Biopsy from the fracture site showed metastatic breast cancer.  Oncology was following.  She does not want to seek any treatment for cancer and wants to discuss with palliative care regarding hospice options.  PT/OT recommend home health. We are waiting for hospice arrangement  at home before proceeding with discharge .  Hemodynamically stable for discharge.   Assessment & Plan:   Active Problems:   Pathological fracture of shaft of right femur (HCC)   Metastatic cancer (HCC)   Lytic bone lesion of left femur   Goals of care, counseling/discussion   Right closed femur fracture: Likely pathological fracture. Imaging findings as above.Underwent  nailing of the left femur. Continue pain management.  PT/OT done,recommended HH.  Right shoulder pain: Patient's right shoulder was found to be significantly edematous/tender.   She was also complaining of pain.  Right x-ray showed mildly displaced fracture through the base of the acromion.  No plan for intervention.Started on comfort sling.  Right breast mass with evidence of  metastasis: She had felt right breast lump a year ago but did not want to seek medical attention. She has been followed by her PCP and has been referred to breast clinic. MRI of the brain done on 3/17 due to pain on extremities, trouble walking. It showed 13 mm focus of T2 and diffusion-weighted signal hyperintensity in the right frontoparietal area, suspicious for metastatic disease. CT chest/abdomen/pelvis with contrast done here showed large left-sided breast mass, diffuse metastatic skeletal lesions with pathological fractures of the ribs, hepatic metastasis. MRI of the brain done on this admission showed no metastatic lesions but showed skeletal metastatic disease right frontal bone, C2, C4 vertebral bodies. Biopsy from the fracture site showed metastatic breast cancer.  Oncology was following.  She does not want to seek any treatment for cancer . Palliative care evaluated her.  Referral provided to set up hospice at home.  Case manager consulted She has been ordered a dose of Zometa for her osteolytic lesions.  Elevated blood pressure: Most likely secondary to pain. Continue to monitor blood pressure. Continue as needed medication.No H/O HTN   Constipation: Continue bowel regimen         DVT prophylaxis:Lovenox Code Status: Full code Family Communication: Patient understands the plan. Discussed with friend at bed side on 05/17/19 Disposition Plan: Patient is from home.  Hemodynamically stable from medical perspective for discharge  to  home with hospice.  Waiting for hospice set up  Consultants: Orthopedics, oncology,palliative care  Procedures: Nailing of right femur  Antimicrobials:  Anti-infectives (From admission, onward)   Start     Dose/Rate Route Frequency Ordered Stop    05/13/19 2000  ceFAZolin (ANCEF) IVPB 2g/100 mL premix     2 g 200 mL/hr over 30 Minutes Intravenous Every 12 hours 05/13/19 1117 05/15/19 1444   05/11/19 2000  ceFAZolin (ANCEF) IVPB 2g/100 mL premix     2 g 200 mL/hr over 30 Minutes Intravenous Every 8 hours 05/11/19 1555 05/12/19 1300   05/11/19 1345  vancomycin (VANCOCIN) powder  Status:  Discontinued       As needed 05/11/19 1346 05/11/19 1419   05/11/19 1159  ceFAZolin (ANCEF) 2-4 GM/100ML-% IVPB    Note to Pharmacy: [name]   : cabinet override      05/11/19 1159 05/11/19 2206      Subjective: Patient seen and examined the bedside this morning.  Hemodynamically stable.  Looks comfortable today.  She did not complain of any new problems.  She is interested to go to home with hospice.  Her friend was at the bedside.  Objective: Vitals:   05/16/19 0720 05/16/19 1919 05/17/19 0248 05/17/19 0755  BP: 119/67 114/67 127/70 124/61  Pulse: 80 74 67 66  Resp: 18 18 16 17   Temp: 98.4 F (36.9 C) 99 F (37.2 C) 98 F (36.7 C) 98.5 F (36.9 C)  TempSrc: Oral Oral Oral Oral  SpO2: 91% 92% 95% 94%  Weight:      Height:        Intake/Output Summary (Last 24 hours) at 05/17/2019 0800 Last data filed at 05/16/2019 0859 Gross per 24 hour  Intake 0 ml  Output --  Net 0 ml   Filed Weights   05/10/19 1416 05/11/19 1115  Weight: 74.4 kg 73.9 kg    Examination:    General exam: Overall comfortable besides general weakness Respiratory system:no wheezes or crackles  Cardiovascular system: S1 & S2 heard, RRR. Gastrointestinal system: Abdomen is nondistended, soft and nontender. No organomegaly or masses felt. Normal bowel sounds heard. Central nervous system: Alert and oriented. No focal neurological deficits. Extremities: No edema, no clubbing ,no cyanosis, clean surgical wound on the right thigh  skin: Ecchymotic rash on the right arm  Data Reviewed: I have personally reviewed following labs and imaging  studies  CBC: Recent Labs  Lab 05/10/19 1530 05/11/19 0333 05/12/19 0433 05/13/19 0406 05/14/19 0418 05/15/19 0626 05/16/19 0502  WBC 11.2*   < > 8.7 7.9 6.6 7.3 7.7  NEUTROABS 10.1*  --   --   --   --   --   --   HGB 12.6   < > 10.6* 9.8* 9.7* 9.6* 9.3*  HCT 38.2   < > 32.1* 30.8* 30.3* 30.0* 27.7*  MCV 90.7   < > 91.7 93.1 93.2 91.5 92.0  PLT 180   < > 150 139* 121* 132* 146*   < > = values in this interval not displayed.   Basic Metabolic Panel: Recent Labs  Lab 05/10/19 1530 05/11/19 0333 05/12/19 0433 05/13/19 0406  NA 139 139 138 139  K 3.9 4.0 5.0 4.1  CL 102 104 101 101  CO2 25 26 27 29   GLUCOSE 130* 91 108* 91  BUN 27* 31* 20 20  CREATININE 0.74 0.96 0.90 1.06*  CALCIUM 9.1 9.0 8.6* 8.9   GFR: Estimated Creatinine Clearance: 44.5 mL/min (A) (by  C-G formula based on SCr of 1.06 mg/dL (H)). Liver Function Tests: Recent Labs  Lab 05/10/19 1530 05/12/19 0939  AST 51* 93*  ALT 20 68*  ALKPHOS 168* 161*  BILITOT 0.6 0.5  PROT 7.6 6.3*  ALBUMIN 4.4 3.3*   No results for input(s): LIPASE, AMYLASE in the last 168 hours. No results for input(s): AMMONIA in the last 168 hours. Coagulation Profile: Recent Labs  Lab 05/15/19 0626  INR 1.1   Cardiac Enzymes: No results for input(s): CKTOTAL, CKMB, CKMBINDEX, TROPONINI in the last 168 hours. BNP (last 3 results) No results for input(s): PROBNP in the last 8760 hours. HbA1C: No results for input(s): HGBA1C in the last 72 hours. CBG: No results for input(s): GLUCAP in the last 168 hours. Lipid Profile: No results for input(s): CHOL, HDL, LDLCALC, TRIG, CHOLHDL, LDLDIRECT in the last 72 hours. Thyroid Function Tests: No results for input(s): TSH, T4TOTAL, FREET4, T3FREE, THYROIDAB in the last 72 hours. Anemia Panel: No results for input(s): VITAMINB12, FOLATE, FERRITIN, TIBC, IRON, RETICCTPCT in the last 72 hours. Sepsis Labs: No results for input(s): PROCALCITON, LATICACIDVEN in the last 168  hours.  Recent Results (from the past 240 hour(s))  Respiratory Panel by RT PCR (Flu A&B, Covid) - Nasopharyngeal Swab     Status: None   Collection Time: 05/10/19  4:46 PM   Specimen: Nasopharyngeal Swab  Result Value Ref Range Status   SARS Coronavirus 2 by RT PCR NEGATIVE NEGATIVE Final    Comment: (NOTE) SARS-CoV-2 target nucleic acids are NOT DETECTED. The SARS-CoV-2 RNA is generally detectable in upper respiratoy specimens during the acute phase of infection. The lowest concentration of SARS-CoV-2 viral copies this assay can detect is 131 copies/mL. A negative result does not preclude SARS-Cov-2 infection and should not be used as the sole basis for treatment or other patient management decisions. A negative result may occur with  improper specimen collection/handling, submission of specimen other than nasopharyngeal swab, presence of viral mutation(s) within the areas targeted by this assay, and inadequate number of viral copies (<131 copies/mL). A negative result must be combined with clinical observations, patient history, and epidemiological information. The expected result is Negative. Fact Sheet for Patients:  PinkCheek.be Fact Sheet for Healthcare Providers:  GravelBags.it This test is not yet ap proved or cleared by the [place] FDA and  has been authorized for detection and/or diagnosis of SARS-CoV-2 by FDA under an Emergency Use Authorization (EUA). This EUA will remain  in effect (meaning this test can be used) for the duration of the COVID-19 declaration under Section 564(b)(1) of the Act, 21 U.S.C. section 360bbb-3(b)(1), unless the authorization is terminated or revoked sooner.    Influenza A by PCR NEGATIVE NEGATIVE Final   Influenza B by PCR NEGATIVE NEGATIVE Final    Comment: (NOTE) The Xpert Xpress SARS-CoV-2/FLU/RSV assay is intended as an aid in  the diagnosis of influenza from Nasopharyngeal  swab specimens and  should not be used as a sole basis for treatment. Nasal washings and  aspirates are unacceptable for Xpert Xpress SARS-CoV-2/FLU/RSV  testing. Fact Sheet for Patients: PinkCheek.be Fact Sheet for Healthcare Providers: GravelBags.it This test is not yet approved or cleared by the [place] FDA and  has been authorized for detection and/or diagnosis of SARS-CoV-2 by  FDA under an Emergency Use Authorization (EUA). This EUA will remain  in effect (meaning this test can be used) for the duration of the  Covid-19 declaration under Section 564(b)(1) of the Act, 21  U.S.C. section 360bbb-3(b)(1), unless the authorization is  terminated or revoked. Performed at [facility], [place] [address] [place], [place] [address]   Surgical pcr screen     Status: None   Collection Time: 05/13/19  4:18 AM   Specimen: Nasal Mucosa; Nasal Swab  Result Value Ref Range Status   MRSA, PCR NEGATIVE NEGATIVE Final   Staphylococcus aureus NEGATIVE NEGATIVE Final    Comment: (NOTE) The Xpert SA Assay (FDA approved for NASAL specimens in patients 34 years of age and older), is one component of a comprehensive surveillance program. It is not intended to diagnose infection nor to guide or monitor treatment. Performed at [facility], [place] [address] [place], [place] [address]          Radiology Studies: DG Shoulder Right  Result Date: 05/16/2019 CLINICAL DATA:  Pain and bruising and immobility of the right shoulder for approximately 6 weeks. No known injury. EXAM: RIGHT SHOULDER - 2+ VIEW COMPARISON:  05/11/2019 humerus films. FINDINGS: The glenohumeral joint is maintained. No fracture of the humeral head or neck. There is a mildly displaced fracture through the base of the acromion. The [facility] joint appears to be intact. No definite clavicle fracture. IMPRESSION: Mildly displaced fracture through the base of  the acromion. Electronically Signed   By: Marijo Sanes M.D.   On: 05/16/2019 09:45        Scheduled Meds: . acetaminophen  1,000 mg Oral Q6H  . polyethylene glycol  17 g Oral Once  . senna  1 tablet Oral BID  . sodium chloride flush  3 mL Intravenous Q12H   Continuous Infusions:    LOS: 7 days    Time spent:25 mins. More than 50% of that time was spent in counseling and/or coordination of care.      Shelly Coss, MD [facility] P3/30/2021, 8:00 AM

## 2019-05-17 NOTE — Plan of Care (Signed)
  Problem: Pain Managment: Goal: General experience of comfort will improve Outcome: Progressing   Problem: Safety: Goal: Ability to remain free from injury will improve Outcome: Progressing   Problem: Skin Integrity: Goal: Risk for impaired skin integrity will decrease Outcome: Progressing   

## 2019-05-17 NOTE — Progress Notes (Addendum)
PT Cancellation Note  Patient Details Name: Yesenia Sullivan MRN: [id number] DOB: [birth year]   Cancelled Treatment:    Reason Eval/Treat Not Completed: Patient declined, no reason specified(pt aware of CA diagnoses and reports desire to pursue palliative approach and requested defer therapy until after palliative consult) Frequency decreased to 3x/wk   [place] 05/17/2019, 9:36 AM  Bayard Males, PT Acute Rehabilitation Services Pager: [phone] Office: [phone]

## 2019-05-17 NOTE — Consult Note (Signed)
Consultation Note Date: 05/17/2019   Patient Name: Yesenia Sullivan  DOB: 1945/09/15  MRN: [id number]  Age / Sex: 74 y.o., female  PCP: Patient, No Pcp Per Referring Physician: Shelly Coss, MD  Reason for Consultation: Establishing goals of care  HPI/Patient Profile: 74 y.o. female with no past medical history   admitted on 05/10/2019 with femur fracture. Underwent surgery for fixation. Further workup has revealed diagnosis of breast cancer with metastasis to her liver, skull, ribs, scapula and possibly lungs. During admission she fractured her acromion. She has met with Oncology and does not want to pursue further workup or treatment. Palliative medicine consulted for continued goals of care.    Clinical Assessment and Goals of Care:  I have reviewed medical records including EPIC notes, labs and imaging, examined the patient and met at bedside with Romie Minus and her friend Kathlee Nations  to discuss diagnosis prognosis, [place], EOL wishes, disposition and options.  I introduced Palliative Medicine as specialized medical care for people living with serious illness. It focuses on providing relief from the symptoms and stress of a serious illness.   We discussed a brief life review of the patient. 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 She has two master's degrees. She is widowed having lost her husband Gershon Mussel about 7 years ago. She did not mention any children. Her spiritual life is very full and meaningful to her and those she counsels.   As far as functional and nutritional status she has noticed a significant decline over the last year. She has not appetite. Her energy is fading fast. She notes she can sleep for a week straight and still fee fatigued. Prior to admission she was living independently, but feels that is  no longer possible given her femur fractures and new acromion fracture and high risk for continued decline.   We discussed her current illness and what it means in the larger context of her on-going co-morbidities.  Natural disease trajectory and expectations at EOL were discussed. She is very clear in her goals being- limiting pain, being at home, and experiencing a peaceful transition with symptom management.   The difference between aggressive medical intervention and comfort care was considered in light of the patient's goals of care.  Advanced directives, concepts specific to code status, artifical feeding and hydration, and rehospitalization were considered and discussed. She has designated her brother Iona Beard as her HCPOA.  MOST form was complete with following elections:   1. DNR  2. Comfort measures only  3. Determine antibiotics at time indicated  4. No artificial hydration or nutrition  Hospice and Palliative Care services outpatient were explained and offered. She requests services of [facility]. Will need equipment before discharge.  Symptom management was discussed- main concern is limiting pain- following plan made:   1. Start oxycontin '15mg'$  BID  2. Continue oxycodone 5-'10mg'$  q4hours as needed for breakthrough pain  3. Start dexamethasone '2mg'$  daily with breakfast and lunch  4. Senna 2 tabs po QHS for bowel prophylaxis  5. Miralax 17grams daily as needed if increasing constipation noted+  6. Continue to utilize prayer and her spiritual comfort for pain and anxiety   Primary Decision Maker PATIENT    SUMMARY OF RECOMMENDATIONS -DNR -1. Start oxycontin '15mg'$  BID  2. Continue oxycodone 5-'10mg'$  q4hours as needed for breakthrough pain  3. Start dexamethasone '2mg'$  daily with breakfast and lunch  4. Senna 2 tabs po QHS for bowel prophylaxis  5. Miralax 17grams daily as needed if increasing constipation noted ++Discharging provider please write for above prescriptions on  discharge -TOC referral to set up Hospice at home    Code Status/Advance Care Planning:  DNR  Additional Recommendations (Limitations, Scope, Preferences):  Full Comfort Care  Prognosis:    < 6 months due to rapidly progressing cancer affecting functional status- plan made for comfort measures only and Hospice services in the home  Discharge Planning: Home with Hospice  Primary Diagnoses: Present on Admission: . 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 The following aspects are pertinent.  Past Medical History:  Diagnosis Date  . URI (upper respiratory infection)    Social History   Socioeconomic History  . Marital status: Widowed    Spouse name: Not on file  . Number of children: Not on file  . Years of education: Not on file  . Highest education level: Not on file  Occupational History  . Not on file  Tobacco Use  . Smoking status: Never Smoker  . Smokeless tobacco: Never Used  Substance and Sexual Activity  . Alcohol use: Never  . Drug use: Never  . Sexual activity: Not on file  Other Topics Concern  . Not on file  Social History Narrative  . Not on file   Social Determinants of Health   Financial Resource Strain:   . Difficulty of Paying Living Expenses:   Food Insecurity:   . Worried About Charity fundraiser in the Last Year:   . Arboriculturist in the Last Year:   Transportation Needs:   . Film/video editor (Medical):   Marland Kitchen Lack of Transportation (Non-Medical):   Physical Activity:   . Days of Exercise per Week:   . Minutes of Exercise per Session:   Stress:   . Feeling of Stress :   Social Connections:   . Frequency of Communication with Friends and Family:   . Frequency of Social Gatherings with Friends and Family:   . Attends Religious Services:   . Active Member of Clubs or Organizations:   . Attends Archivist  Meetings:   Marland Kitchen Marital Status:    History reviewed. No pertinent family history. Scheduled Meds: . acetaminophen  1,000 mg Oral Q6H  . dexamethasone  2 mg Oral BID WC  . oxyCODONE  15 mg Oral Q12H  . polyethylene glycol  17 g Oral Once  . senna  1 tablet Oral BID  . sodium chloride flush  3 mL Intravenous Q12H   Continuous Infusions: . zoledronic acid (ZOMETA) IV     PRN Meds:.hydrALAZINE, magnesium hydroxide, methocarbamol, ondansetron **OR** ondansetron (ZOFRAN) IV, oxyCODONE, polyethylene glycol, sodium phosphate, sorbitol Medications Prior to Admission:  Prior to Admission medications   Medication Sig Start Date End Date Taking? Authorizing Provider  acetaminophen (TYLENOL) 325 MG tablet Take 325 mg by mouth every 6 (six) hours as  needed for mild pain or headache.   Yes [provider]  Cholecalciferol (VITAMIN D3 PO) Take 1 capsule by mouth daily.   Yes [provider]  ibuprofen (ADVIL) 200 MG tablet Take 200 mg by mouth every 6 (six) hours as needed for headache or moderate pain.   Yes [provider]  MAGNESIUM PO Take 1 tablet by mouth daily.   Yes [provider]  Multiple Vitamin (MULTIVITAMIN WITH MINERALS) TABS tablet Take 1 tablet by mouth daily.   Yes [provider]  predniSONE (DELTASONE) 10 MG tablet Take 2 tablets (20 mg total) by mouth daily with breakfast. Take 2 pills a day for 10 days then decrease to 1 pill for 2 days and then 1/2 pill for 2 days. 05/04/19  Yes Matilde Haymaker, MD  enoxaparin (LOVENOX) 40 MG/0.4ML injection Inject 0.4 mLs (40 mg total) into the skin daily. 05/13/19 06/12/19  Delray Alt, PA-C  methocarbamol (ROBAXIN) 500 MG tablet Take 1 tablet (500 mg total) by mouth every 6 (six) hours as needed for muscle spasms. 05/13/19   Delray Alt, PA-C  oxyCODONE (OXY IR/ROXICODONE) 5 MG immediate release tablet Take 1 tablet (5 mg total) by mouth every 4 (four) hours as needed for severe pain. 05/13/19   Delray Alt, PA-C   Allergies  Allergen Reactions  . Codeine Nausea And Vomiting  . Penicillins Other (See Comments)    Caused skin rash with IM injection Tolerated Intraoperative Ancef   Review of Systems  Constitutional: Positive for activity change and appetite change.  Musculoskeletal: Positive for arthralgias and myalgias.  Psychiatric/Behavioral: Negative for agitation and sleep disturbance. The patient is not nervous/anxious.     Physical Exam Vitals and nursing note reviewed.  Constitutional:      Appearance: Normal appearance.  HENT:     Head: Normocephalic and atraumatic.  Pulmonary:     Effort: Pulmonary effort is normal.  Skin:    General: Skin is warm and dry.     Coloration: Skin is not jaundiced.  Neurological:     Mental Status: She is alert and oriented to person, place, and time.  Psychiatric:        Mood and Affect: Mood normal.        Behavior: Behavior normal.        Thought Content: Thought content normal.     Vital Signs: BP 136/85 (BP Location: Right Arm)   Pulse 69   Temp 97.6 F (36.4 C) (Oral)   Resp 19   Ht 5' 2.75" (1.594 m)   Wt 73.9 kg   SpO2 95%   BMI 29.10 kg/m  Pain Scale: 0-10 POSS *See Group Information*: 1-Acceptable,Awake and alert Pain Score: 0-No pain   SpO2: SpO2: 95 % O2 Device:SpO2: 95 % O2 Flow Rate: .O2 Flow Rate (L/min): 2 L/min  IO: Intake/output summary: No intake or output data in the 24 hours ending 05/17/19 1135  LBM: Last BM Date: 05/14/19 Baseline Weight: Weight: 74.4 kg Most recent weight: Weight: 73.9 kg     Palliative Assessment/Data: PPS: 30%     Thank you for this consult. Palliative medicine will continue to follow and assist as needed.   Time In: 1030 Time Out: 1205 Time Total: 95 minutes Prolonged services; yes Greater than 50%  of this time was spent counseling and coordinating care related to the above assessment and plan.  Signed by: Mariana Kaufman, AGNP-C Palliative Medicine     Please contact Palliative Medicine Team  phone at [phone] for questions and concerns.  For individual provider: See Shea Evans

## 2019-05-17 NOTE — Progress Notes (Addendum)
Hydrologist [facility]) Hospital Liaison: RN note     Notified by Transition of Care Manger, Whitman Hero, RN of patient/family request for [facility] services at home after discharge. Chart and patient information reviewed by [facility] physician. Hospice eligibility confirmed.     Writer spoke with patient and friend, Kathlee Nations  to initiate education related to hospice philosophy, services and team approach to care.  Patient and Kathlee Nations  verbalized understanding of information given. Per discussion, plan is for discharge to home by PTAR.   Please send signed and completed DNR form home with patient/family. Patient will need prescriptions for discharge comfort medications.      DME needs have been discussed, patient currently has the following equipment in the home:       walker.  Patient/family requests the following DME for delivery to the home: hospital bed, 3N1, and W/C. [place] equipment manager has been notified and will contact DME provider to arrange delivery to the home. Home address has been verified and is correct in the chart.   Kathlee Nations is the family member to contact to arrange time of delivery.      [facility] aware of the above. Please notify [facility] when patient is ready to leave the unit at discharge. (Call [phone] or [phone] after 5pm.) [facility] information and contact numbers given to  [place].       Please call with any hospice related questions.      Thank you for this referral.     Farrel Gordon, RN, [facility] (listed on [place] under [place])   [phone]

## 2019-05-17 NOTE — Plan of Care (Signed)
  Problem: Education: Goal: Knowledge of General Education information will improve Description: Including pain rating scale, medication(s)/side effects and non-pharmacologic comfort measures Outcome: Progressing   Problem: Nutrition: Goal: Adequate nutrition will be maintained Outcome: Progressing   Problem: Coping: Goal: Level of anxiety will decrease Outcome: Progressing   Problem: Elimination: Goal: Will not experience complications related to urinary retention Outcome: Progressing   Problem: Pain Managment: Goal: General experience of comfort will improve Outcome: Progressing Note: Doing well with pain today, received 12hr oxycodone and has worked well.    Problem: Safety: Goal: Ability to remain free from injury will improve Outcome: Progressing   Problem: Skin Integrity: Goal: Risk for impaired skin integrity will decrease Outcome: Progressing

## 2019-05-17 NOTE — Plan of Care (Signed)
  Problem: Education: Goal: Knowledge of General Education information will improve Description Including pain rating scale, medication(s)/side effects and non-pharmacologic comfort measures Outcome: Progressing   Problem: Clinical Measurements: Goal: Will remain free from infection Outcome: Progressing   Problem: Nutrition: Goal: Adequate nutrition will be maintained Outcome: Progressing   Problem: Coping: Goal: Level of anxiety will decrease Outcome: Progressing   

## 2019-05-18 DIAGNOSIS — M898X9 Other specified disorders of bone, unspecified site: Secondary | ICD-10-CM

## 2019-05-18 DIAGNOSIS — Z170 Estrogen receptor positive status [ER+]: Secondary | ICD-10-CM

## 2019-05-18 DIAGNOSIS — Z515 Encounter for palliative care: Secondary | ICD-10-CM

## 2019-05-18 DIAGNOSIS — Z7189 Other specified counseling: Secondary | ICD-10-CM

## 2019-05-18 DIAGNOSIS — C50811 Malignant neoplasm of overlapping sites of right female breast: Secondary | ICD-10-CM

## 2019-05-18 MED ORDER — METHOCARBAMOL 500 MG PO TABS: 500.0000 mg | ORAL_TABLET | Freq: Four times a day (QID) | ORAL | 0 refills | Status: AC | PRN

## 2019-05-18 MED ORDER — DEXAMETHASONE 2 MG PO TABS: 2.0000 mg | ORAL_TABLET | Freq: Every day | ORAL | 0 refills | Status: AC

## 2019-05-18 MED ORDER — ENOXAPARIN SODIUM 40 MG/0.4ML ~~LOC~~ SOLN: 40.0000 mg | SUBCUTANEOUS | 0 refills | Status: DC

## 2019-05-18 MED ORDER — SENNA 8.6 MG PO TABS: 1.0000 | ORAL_TABLET | Freq: Two times a day (BID) | ORAL | 0 refills | Status: AC

## 2019-05-18 MED ORDER — ASPIRIN EC 325 MG PO TBEC: 325.0000 mg | DELAYED_RELEASE_TABLET | Freq: Two times a day (BID) | ORAL | 0 refills | Status: AC

## 2019-05-18 MED ORDER — OXYCODONE HCL 5 MG PO TABS: 5.0000 mg | ORAL_TABLET | Freq: Four times a day (QID) | ORAL | 0 refills | Status: AC | PRN

## 2019-05-18 MED ORDER — POLYETHYLENE GLYCOL 3350 17 G PO PACK: 17.0000 g | PACK | Freq: Every day | ORAL | 0 refills | Status: AC | PRN

## 2019-05-18 MED ORDER — OXYCODONE HCL ER 15 MG PO T12A: 15.0000 mg | EXTENDED_RELEASE_TABLET | Freq: Two times a day (BID) | ORAL | 0 refills | Status: AC

## 2019-05-18 NOTE — TOC Transition Note (Addendum)
Transition of Care [facility]) - CM/SW Discharge Note   Patient Details  Name: Yesenia Sullivan MRN: [id number] Date of Birth: 15-Apr-1945  Transition of Care [facility]) CM/SW Contact:  Sharin Mons, RN Phone Number: [phone] 05/18/2019, 11:49 AM   Clinical Narrative:    Patient will DC to: home with hospice care/ [facility] Anticipated DC date: 05/18/2019 Family notified: Kathlee Nations ( friend) Transport by: Corey Harold   Per MD patient ready for DC today. RN, patient, patient's friend Kathlee Nations) made aware of d/c plan. Once DME equipment has been delivered into pt's home ambulance transport requested for patient.    TOC team will continue to monitor....  05/18/2019 @ 1430  Pt's  DME has been delivered to the home.  PTAR services arranged for pt's transportation to home. . Final next level of care: [place] Barriers to Discharge: No Barriers Identified   Patient Goals and CMS Choice Patient states their goals for this hospitalization and ongoing recovery are:: go home to get better CMS Medicare.gov Compare Post Acute Care list provided to:: Patient Choice offered to / list presented to : Patient  Discharge Placement                       Discharge Plan and Services   Discharge Planning Services: CM Consult            DME Arranged: 3-N-1, Wheelchair manual DME Agency: [employer] Date DME Agency Contacted: 05/12/19 Time DME Agency Contacted: 1233 Representative spoke with at DME Agency: [place]: PT   Date [place]: 05/12/19 Time [place]: 1233 Representative spoke with at [place]: [place]) Interventions     Readmission Risk Interventions No flowsheet data found.

## 2019-05-18 NOTE — Discharge Summary (Signed)
Physician Discharge Summary  Yesenia Sullivan [id number] DOB: 04-25-1945 DOA: 05/10/2019  PCP: Patient, No Pcp Per  Admit date: 05/10/2019 Discharge date: 05/18/2019  Admitted From: Home Disposition:  Home with Hospice  Discharge Condition:Stable CODE STATUS:DNR Diet recommendation: Regular  Brief/Interim Summary:  Patient is a 74 female with no significant past medical history who presented to the emergency department on 3/23 with complaints of left lower extremity pain, swelling.   She also reported noticing of right breast lump about a year ago. On 3/17, she had presented to the emergency department, at the time MRI of the brain showed findings suspicious for osseous metastatic lesion. She wanted to be discharged and follow as an outpatient She has been seen by her physician on 3/23 and has been referred to breast clinic for further evaluation. On the way out of her PCPs office, she suddenly felt a pop on her right thigh followed by severe pain and obvious deformity.  X-ray done in the emergency room showed right femur fracture which was severely displaced and angulated. CT scan of the left femur showed lytic lesion in the peritrochanteric region with more than one third of the femur involved. Orthopedics consulted.  Underwent nailing of left femur on 05/13/2019.   Biopsy from the fracture site showed metastatic breast cancer. Oncology was following.  She does not want to seek any treatment for cancer .palliative care was following and we arranged hospice at home.  PT/OT recommend home health.  Hemodynamically stable for discharge today.  Following problems were addressed during her hospitalization:  Right closed femur fracture: Likely pathological fracture. Imaging findings as above.Underwent  nailing of the left femur. Continue pain management.  PT/OT done,recommended HH.  Follow-up with orthopedics in 2 weeks.  Right shoulder pain: Patient's right shoulder was found to be significantly  edematous/tender.  She was also complaining of pain.  Right x-ray showed mildly displaced fracture through the base of the acromion.  No plan for intervention.Started on comfort sling.  Right breast mass with evidence of  metastasis: She had felt right breast lump a year ago but did not want to seek medical attention. She has been followed by her PCP and has been referred to breast clinic. MRI of the brain done on 3/17 due to pain on extremities, trouble walking. It showed 13 mm focus of T2 and diffusion-weighted signal hyperintensity in the right frontoparietal area, suspicious for metastatic disease. CT chest/abdomen/pelvis with contrast done here showed large left-sided breast mass, diffuse metastatic skeletal lesions with pathological fractures of the ribs, hepatic metastasis. MRI of the brain done on this admission showed no metastatic lesions but showed skeletal metastatic disease right frontal bone, C2, C4 vertebral bodies. Biopsy from the fracture site showed metastatic breast cancer.  Oncology was following.  She does not want to seek any treatment for cancer . Palliative care evaluated her.  Referral provided to set up hospice at home.  Case manager consulted She was given  a dose of Zometa for her osteolytic lesions.  Elevated blood pressure:Stable now  Constipation: Continue bowel regimen    Discharge Diagnoses:  Active Problems:   Pathological fracture of shaft of right femur (HCC)   Metastatic cancer (HCC)   Lytic bone lesion of left femur   Goals of care, counseling/discussion   Liver lesion   Malignant neoplasm of overlapping sites of right breast in female, estrogen receptor positive ([place])   Palliative care by specialist   Bone pain   Visceral pain   Advanced care planning/counseling  discussion   Closed fracture of shaft of right femur [facility])    Discharge Instructions  Discharge Instructions    Diet general   Complete by: As directed    Discharge instructions    Complete by: As directed    1)Please follow up with orthopedics in 2 weeks.  Name and number the provider has been attached 2)Follow up with Hospice at home. 3)Please take prescribed medications as instructed.   Increase activity slowly   Complete by: As directed      Allergies as of 05/18/2019      Reactions   Codeine Nausea And Vomiting   Penicillins Other (See Comments)   Caused skin rash with IM injection Tolerated Intraoperative Ancef      Medication List    STOP taking these medications   ibuprofen 200 MG tablet Commonly known as: ADVIL   predniSONE 10 MG tablet Commonly known as: DELTASONE     TAKE these medications   acetaminophen 325 MG tablet Commonly known as: TYLENOL Take 325 mg by mouth every 6 (six) hours as needed for mild pain or headache.   aspirin EC 325 MG tablet Take 1 tablet (325 mg total) by mouth in the morning and at bedtime for 21 days.   dexamethasone 2 MG tablet Commonly known as: DECADRON Take 1 tablet (2 mg total) by mouth daily.   MAGNESIUM PO Take 1 tablet by mouth daily.   methocarbamol 500 MG tablet Commonly known as: ROBAXIN Take 1 tablet (500 mg total) by mouth every 6 (six) hours as needed for muscle spasms.   multivitamin with minerals Tabs tablet Take 1 tablet by mouth daily.   oxyCODONE 5 MG immediate release tablet Commonly known as: Oxy IR/ROXICODONE Take 1-2 tablets (5-10 mg total) by mouth every 6 (six) hours as needed for severe pain.   oxyCODONE 15 mg 12 hr tablet Commonly known as: OXYCONTIN Take 1 tablet (15 mg total) by mouth every 12 (twelve) hours.   polyethylene glycol 17 g packet Commonly known as: MIRALAX / GLYCOLAX Take 17 g by mouth daily as needed.   senna 8.6 MG Tabs tablet Commonly known as: SENOKOT Take 1 tablet (8.6 mg total) by mouth 2 (two) times daily.   VITAMIN D3 PO Take 1 capsule by mouth daily.            Durable Medical Equipment  (From admission, onward)         Start      Ordered   05/12/19 1228  For home use only DME standard manual wheelchair with seat cushion  Once    Comments: Patient suffers from weakness which impairs their ability to perform daily activities like walking in the home.  A rolling walker  will not resolve issue with performing activities of daily living. A wheelchair will allow patient to safely perform daily activities. Patient can safely propel the wheelchair in the home or has a caregiver who can provide assistance. Length of need 12 months Accessories: elevating leg rests (ELRs), wheel locks, extensions and anti-tippers.   05/12/19 1228   05/12/19 1227  For home use only DME 3 n 1  Once     05/12/19 1228         Follow-up Information    Haddix, Thomasene Lot, MD. Schedule an appointment as soon as possible for a visit in 2 weeks.   Specialty: Orthopedic Surgery Why: For wound re-check, For repeat x-rays Contact information: [place] [address] [phone]  Allergies  Allergen Reactions  . Codeine Nausea And Vomiting  . Penicillins Other (See Comments)    Caused skin rash with IM injection Tolerated Intraoperative Ancef    Consultations:  Oncology, palliative care, orthopedics   Procedures/Studies: DG Chest 1 View  Result Date: 05/10/2019 CLINICAL DATA:  Preop for femur surgery. EXAM: CHEST  1 VIEW COMPARISON:  None. FINDINGS: The heart size and mediastinal contours are within normal limits. Both lungs are clear. The visualized skeletal structures are unremarkable. IMPRESSION: No active disease. Electronically Signed   By: Marijo Conception M.D.   On: 05/10/2019 15:26   DG Shoulder Right  Result Date: 05/16/2019 CLINICAL DATA:  Pain and bruising and immobility of the right shoulder for approximately 6 weeks. No known injury. EXAM: RIGHT SHOULDER - 2+ VIEW COMPARISON:  05/11/2019 humerus films. FINDINGS: The glenohumeral joint is maintained. No fracture of the humeral head or neck. There is a  mildly displaced fracture through the base of the acromion. The [facility] joint appears to be intact. No definite clavicle fracture. IMPRESSION: Mildly displaced fracture through the base of the acromion. Electronically Signed   By: Marijo Sanes M.D.   On: 05/16/2019 09:45   CT CHEST W CONTRAST  Result Date: 05/12/2019 CLINICAL DATA:  Metastatic disease, staging and assessment for primary. EXAM: CT CHEST, ABDOMEN, AND PELVIS WITH CONTRAST TECHNIQUE: Multidetector CT imaging of the chest, abdomen and pelvis was performed following the standard protocol during bolus administration of intravenous contrast. CONTRAST:  161mL OMNIPAQUE IOHEXOL 300 MG/ML  SOLN COMPARISON:  Chest radiograph 04/12/2019 FINDINGS: CT CHEST FINDINGS Cardiovascular: Atherosclerotic calcification of the aortic arch. Mediastinum/Nodes: No pathologic thoracic adenopathy is observed. Lungs/Pleura: Likely subpleural lymph node along the minor fissure measuring 0.7 by 0.6 by 0.2 cm shown on image 70/6. 2 mm right middle lobe nodule on image 90/4. Mild dependent atelectasis in the posterior basal segment right lower lobe. Trace adjacent right pleural effusion. 3 mm right lower lobe nodule, image 97/4. 3 mm lingular nodule, image 92/4. 0.4 by 0.3 cm left lower lobe subpleural nodule, image 111/4. 0.5 by 0.4 cm left lower lobe nodule, image 103/4. Musculoskeletal: 6.4 by 4.9 by 5.8 cm (volume = 95 cm^3) enhancing mass in the right upper breast extending to the cutaneous surface. This abuts the superficial fascia margin of the pectoralis major muscle but I do not observe definite intramuscular invasion. Scattered osseous metastatic disease. Pathologic fracture of the base of the right acromion, image 6/3. Likely pathologic fracture of the inferior tip of the left scapula with surrounding osteoid formation, image 29/3. Numerous scattered bilateral rib lesions, many with pathologic fractures. Permeative mass involving the sternal manubrium. Multilevel  vertebral involvement most notable at T9, and T7. Suspected pathologic fracture of the T7 spinous process. CT ABDOMEN PELVIS FINDINGS Hepatobiliary: Indistinct scattered metastatic lesions are present throughout the liver. Index segment 2 lesion 2.2 by 2.0 cm on image 46/3. Index right hepatic lobe lesion 2.9 by 2.6 cm on image 60/3. Multiple additional scattered lesions compatible with metastatic disease observed. Contracted gallbladder containing gallstones. Mild intrahepatic and extrahepatic biliary dilatation, cause uncertain. Pancreas: Unremarkable Spleen: Unremarkable Adrenals/Urinary Tract: Two nonobstructive left kidney lower pole calculi are present, one measuring 1.1 cm in long axis in the other measuring the same. 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 The ureters and urinary bladder partially obscured by streak artifact from the patient's right hip implant. There appears to be gas anteriorly in the urinary bladder, query  recent catheterization. Left mid kidney cyst posteriorly. Stomach/Bowel: Sigmoid colon diverticulosis. Scattered diverticula of the descending colon. Vascular/Lymphatic: Aortoiliac atherosclerotic vascular disease. No pathologic adenopathy identified. Reproductive: Uterus absent.  Adnexa unremarkable. Other: Small locules of gas in the subcutaneous tissues of the right lower quadrant abdomen, possibly injection related. Musculoskeletal: Right total hip prosthesis. Permeative lesion in the right iliac crest with suspected nondisplaced pathologic fracture extending vertically in the right iliac bone. Lytic lesions in the left iliac crest and anterior superior left iliac spine. Primarily lytic lesions in the left upper sacrum. Primarily lytic lesions in the lumbar spine, including for example the right transverse process of L5. Grade 1 degenerative retrolisthesis at T11-12, T12-L1, and L1-2 with mild grade 1 anterolisthesis at L3-4 and L4-5.  IMPRESSION: 1. Large (95 cubic cm) right upper breast mass likely representing recurrent breast cancer. Extensive scattered metastatic lesions throughout the skeleton, with various pathologic fractures primarily involving the ribs and bilateral scapula as well as the right iliac bone. 2. Scattered masses in the liver are most compatible with metastatic disease. 3. There are a few scattered tiny pulmonary nodules which are nonspecific. 4. Contracted gallbladder containing gallstones. Mild intrahepatic and extrahepatic biliary dilatation, cause uncertain. 5. Nonobstructive left nephrolithiasis. 6. 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 7. Other imaging findings of potential clinical significance: Aortic Atherosclerosis (ICD10-I70.0). Sigmoid colon diverticulosis. Multilevel lumbar spondylosis and degenerative disc disease. Trace right pleural effusion. Electronically Signed   By: Van Clines M.D.   On: 05/12/2019 15:35   MR BRAIN WO CONTRAST  Result Date: 05/04/2019 CLINICAL DATA:  Metastatic disease evaluation. Additional history provided by technologist: Patient reports pain in legs and arms over 2 weeks, trouble walking, breast lump, trouble walking due to leg pain. EXAM: MRI HEAD WITHOUT CONTRAST TECHNIQUE: Multiplanar, multiecho pulse sequences of the brain and surrounding structures were obtained without intravenous contrast. COMPARISON:  No pertinent prior studies available for comparison. FINDINGS: Brain: Please note evaluation for intracranial metastatic disease is limited on this non-contrast examination. There is mild motion degradation of the axial diffusion-weighted imaging. No evidence of acute infarct. No evidence of intracranial mass. No midline shift or extra-axial fluid collection. No chronic intracranial blood products. Mild scattered T2/FLAIR hyperintensity within the cerebral white matter is nonspecific, but consistent with chronic small vessel ischemic disease.  Mild generalized parenchymal atrophy. Vascular: Flow voids maintained within the proximal large arterial vessels. Skull and upper cervical spine: There is a 13 mm focus of T2 hyperintensity and corresponding diffusion weighted hyperintensity within the right frontoparietal calvarium (series 11, image 31) (series 7, image 83). There is abnormal T1 hypointense signal within portions of the upper cervical spine with relative sparing of the C4 vertebral body. Sinuses/Orbits: Visualized orbits demonstrate no acute abnormality. Mild ethmoid sinus mucosal thickening. No significant mastoid effusion. IMPRESSION: 13 mm focus of T2 and diffusion-weighted signal hyperintensity within the right frontoparietal calvarium. There is also abnormal T1 hypointense marrow signal within portions of the visualized upper cervical spine with relative sparing of the C3 vertebra. Findings are indeterminate and osseous metastatic disease cannot be excluded. Consider nonemergent postcontrast MR imaging of the head and contrast-enhanced MRI of the cervical spine for further evaluation. Within the limitations of a noncontrast study, there is no evidence of metastatic disease within the intracranial compartment. No evidence of acute intracranial abnormality. Mild generalized parenchymal atrophy and chronic small vessel ischemic disease. Electronically Signed   By: Kellie Simmering DO   On: 05/04/2019 14:58   MR BRAIN W  WO CONTRAST  Result Date: 05/14/2019 CLINICAL DATA:  Rule out metastatic disease.  Right breast lump. EXAM: MRI HEAD WITHOUT AND WITH CONTRAST TECHNIQUE: Multiplanar, multiecho pulse sequences of the brain and surrounding structures were obtained without and with intravenous contrast. CONTRAST:  7.59mL GADAVIST GADOBUTROL 1 MMOL/ML IV SOLN COMPARISON:  MRI head without contrast 05/04/2019 FINDINGS: Brain: Ventricle size normal. Mild cortical atrophy. Negative for acute infarct. Small white matter hyperintensities bilaterally  consistent with chronic microvascular ischemia. Negative for hemorrhage or mass. Postcontrast imaging demonstrates no enhancing metastatic deposits in the brain. Vascular: Normal arterial flow voids Skull and upper cervical spine: 12 mm enhancing lesion right frontal bone. Infiltrative bone marrow lesions at C2 and C4. These findings are compatible with skeletal metastatic disease. Sinuses/Orbits: Mild mucosal edema paranasal sinuses. Bilateral cataract extraction Other: None IMPRESSION: Negative for metastatic disease to the brain Skeletal metastatic disease right frontal bone, C2, C4 vertebral bodies. Electronically Signed   By: Franchot Gallo M.D.   On: 05/14/2019 10:32   CT ABDOMEN PELVIS W CONTRAST  Result Date: 05/12/2019 CLINICAL DATA:  Metastatic disease, staging and assessment for primary. EXAM: CT CHEST, ABDOMEN, AND PELVIS WITH CONTRAST TECHNIQUE: Multidetector CT imaging of the chest, abdomen and pelvis was performed following the standard protocol during bolus administration of intravenous contrast. CONTRAST:  160mL OMNIPAQUE IOHEXOL 300 MG/ML  SOLN COMPARISON:  Chest radiograph 04/12/2019 FINDINGS: CT CHEST FINDINGS Cardiovascular: Atherosclerotic calcification of the aortic arch. Mediastinum/Nodes: No pathologic thoracic adenopathy is observed. Lungs/Pleura: Likely subpleural lymph node along the minor fissure measuring 0.7 by 0.6 by 0.2 cm shown on image 70/6. 2 mm right middle lobe nodule on image 90/4. Mild dependent atelectasis in the posterior basal segment right lower lobe. Trace adjacent right pleural effusion. 3 mm right lower lobe nodule, image 97/4. 3 mm lingular nodule, image 92/4. 0.4 by 0.3 cm left lower lobe subpleural nodule, image 111/4. 0.5 by 0.4 cm left lower lobe nodule, image 103/4. Musculoskeletal: 6.4 by 4.9 by 5.8 cm (volume = 95 cm^3) enhancing mass in the right upper breast extending to the cutaneous surface. This abuts the superficial fascia margin of the pectoralis  major muscle but I do not observe definite intramuscular invasion. Scattered osseous metastatic disease. Pathologic fracture of the base of the right acromion, image 6/3. Likely pathologic fracture of the inferior tip of the left scapula with surrounding osteoid formation, image 29/3. Numerous scattered bilateral rib lesions, many with pathologic fractures. Permeative mass involving the sternal manubrium. Multilevel vertebral involvement most notable at T9, and T7. Suspected pathologic fracture of the T7 spinous process. CT ABDOMEN PELVIS FINDINGS Hepatobiliary: Indistinct scattered metastatic lesions are present throughout the liver. Index segment 2 lesion 2.2 by 2.0 cm on image 46/3. Index right hepatic lobe lesion 2.9 by 2.6 cm on image 60/3. Multiple additional scattered lesions compatible with metastatic disease observed. Contracted gallbladder containing gallstones. Mild intrahepatic and extrahepatic biliary dilatation, cause uncertain. Pancreas: Unremarkable Spleen: Unremarkable Adrenals/Urinary Tract: Two nonobstructive left kidney lower pole calculi are present, one measuring 1.1 cm in long axis in the other measuring the same. 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 The ureters and urinary bladder partially obscured by streak artifact from the patient's right hip implant. There appears to be gas anteriorly in the urinary bladder, query recent catheterization. Left mid kidney cyst posteriorly. Stomach/Bowel: Sigmoid colon diverticulosis. Scattered diverticula of the descending colon. Vascular/Lymphatic: Aortoiliac atherosclerotic vascular disease. No pathologic adenopathy identified. Reproductive: Uterus absent.  Adnexa  unremarkable. Other: Small locules of gas in the subcutaneous tissues of the right lower quadrant abdomen, possibly injection related. Musculoskeletal: Right total hip prosthesis. Permeative lesion in the right iliac crest with suspected  nondisplaced pathologic fracture extending vertically in the right iliac bone. Lytic lesions in the left iliac crest and anterior superior left iliac spine. Primarily lytic lesions in the left upper sacrum. Primarily lytic lesions in the lumbar spine, including for example the right transverse process of L5. Grade 1 degenerative retrolisthesis at T11-12, T12-L1, and L1-2 with mild grade 1 anterolisthesis at L3-4 and L4-5. IMPRESSION: 1. Large (95 cubic cm) right upper breast mass likely representing recurrent breast cancer. Extensive scattered metastatic lesions throughout the skeleton, with various pathologic fractures primarily involving the ribs and bilateral scapula as well as the right iliac bone. 2. Scattered masses in the liver are most compatible with metastatic disease. 3. There are a few scattered tiny pulmonary nodules which are nonspecific. 4. Contracted gallbladder containing gallstones. Mild intrahepatic and extrahepatic biliary dilatation, cause uncertain. 5. Nonobstructive left nephrolithiasis. 6. 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 7. Other imaging findings of potential clinical significance: Aortic Atherosclerosis (ICD10-I70.0). Sigmoid colon diverticulosis. Multilevel lumbar spondylosis and degenerative disc disease. Trace right pleural effusion. Electronically Signed   By: Van Clines M.D.   On: 05/12/2019 15:35   CT FEMUR LEFT W CONTRAST  Result Date: 05/12/2019 CLINICAL DATA:  Pathologic right femur fracture. Abnormal left femur x-ray EXAM: CT OF THE LOWER LEFT EXTREMITY WITH CONTRAST TECHNIQUE: Multidetector CT imaging of the lower left extremity was performed according to the standard protocol following intravenous contrast administration. COMPARISON:  Left femur x-ray 05/11/2019 CONTRAST:  151mL OMNIPAQUE IOHEXOL 300 MG/ML  SOLN FINDINGS: Bones/Joint/Cartilage At the cranial most axial image, there is partial visualization of a healing fracture involving  the anterior left iliac crest (series 3, image 1). The bone at the fracture site appears lucent and irregular which could reflect an underlying osseous lesion versus changes related to fracture. Eccentrically located lytic bone lesion within the anterolateral aspect of the subtrochanteric proximal left femur with endosteal scalloping and cortical breakthrough. Lesion measures approximately 3.5 x 2.8 cm (series 7, images 65-66; series 3, images 88-96). It is difficult to accurately assess the percentage of the medullary space occupied by this lesion on CT imaging. Suspect additional lesion within the distal femoral diaphysis where there is subtle endosteal scalloping of the lateral cortex and increased density within the marrow space measuring approximately 2.5 cm (series 7, image 66). Alignment at the hip and knee are maintained without dislocation. Ligaments Suboptimally assessed by CT. Muscles and Tendons No myotendinous abnormality is seen. Soft tissues No well-defined soft tissue mass. No fluid collection or hematoma. No inguinal lymphadenopathy. Sigmoid diverticulosis. IMPRESSION: 1. Eccentrically located lytic bone lesion within the anterolateral aspect of the subtrochanteric proximal left femur with endosteal scalloping and cortical breakthrough. Lesion is at risk for pathologic fracture. 2. Suspect additional lesion within the distal femoral diaphysis where there is subtle endosteal scalloping of the lateral cortex and increased density within the marrow space. These findings are suspicious for metastatic disease. 3. Partial visualization of a healing fracture involving the anterior left iliac crest. The bone at the fracture site appears lucent and irregular concerning for an underlying bone lesion. Electronically Signed   By: Davina Poke D.O.   On: 05/12/2019 15:22   DG Knee Right Port  Result Date: 05/11/2019 CLINICAL DATA:  Fracture, postop. EXAM: PORTABLE RIGHT KNEE - 1-2 VIEW  COMPARISON:   Preoperative radiograph 05/10/2019. Concurrent femur radiograph, reported separately. FINDINGS: Lateral plate and multi screw fixation of displaced distal femoral shaft fracture. Fracture is in improved alignment compared to preoperative imaging. Proximal aspect of the hardware is included on concurrent femur exam. Recent postsurgical change includes air and edema in the soft tissues. IMPRESSION: Lateral plate and screw fixation of displaced distal femoral shaft fracture, improved alignment compared to preoperative imaging. No medial postoperative complication. Electronically Signed   By: Keith Rake M.D.   On: 05/11/2019 15:49   DG Humerus Left  Result Date: 05/11/2019 CLINICAL DATA:  Fracture. EXAM: LEFT HUMERUS - 2+ VIEW COMPARISON:  None. FINDINGS: Cortical margins of the humerus are intact. There is no evidence of fracture or other focal bone lesions. Shoulder and elbow alignment grossly maintained. Soft tissues are unremarkable. Blood pressure cuff overlies the arm and IV in the antecubital fossa. IMPRESSION: Negative radiographs of the left humerus. Electronically Signed   By: Keith Rake M.D.   On: 05/11/2019 15:50   DG Humerus Right  Result Date: 05/11/2019 CLINICAL DATA:  Fracture. EXAM: RIGHT HUMERUS - 2+ VIEW COMPARISON:  None. FINDINGS: Cortical margins of the humerus are intact. There is no evidence of fracture or other focal bone lesions. Shoulder and elbow alignment are grossly maintained. Soft tissues are unremarkable. IMPRESSION: Negative radiographs of the right humerus. Electronically Signed   By: Keith Rake M.D.   On: 05/11/2019 15:52   DG C-Arm 1-60 Min  Result Date: 05/13/2019 CLINICAL DATA:  Intramedullary nail EXAM: LEFT FEMUR 2 VIEWS; DG C-ARM 1-60 MIN COMPARISON:  Radiography from 2 days ago FINDINGS: Multiple fluoroscopic images shows nail fixation of the left femur. By CT, a bone lesion is present proximally in the femur, not depicted on this study.  IMPRESSION: Fluoroscopy for femoral fixation. Electronically Signed   By: Monte Fantasia M.D.   On: 05/13/2019 09:30   DG C-Arm 1-60 Min  Result Date: 05/11/2019 CLINICAL DATA:  ORIF of right femur. FLUOROSCOPY TIME:  1 minutes 58 seconds. Images: 10 EXAM: RIGHT FEMUR 2 VIEWS COMPARISON:  None. FINDINGS: A displaced distal femoral diaphysis fracture seen at the beginning of the study. Throughout the study, the fracture was reduced and a sideplate was affixed to the distal femur, crossing the fracture. The patient is status post right hip replacement as well. Only the distal femoral stem is visualized of the right hip replacement. IMPRESSION: Right distal femoral fracture repair as above. Electronically Signed   By: Dorise Bullion III M.D   On: 05/11/2019 16:01   DG HIP UNILAT WITH PELVIS 2-3 VIEWS RIGHT  Result Date: 05/10/2019 CLINICAL DATA:  Right leg injury. EXAM: DG HIP (WITH OR WITHOUT PELVIS) 2-3V RIGHT COMPARISON:  None. FINDINGS: Status post right total hip arthroplasty. The right acetabular and femoral components appear to be well situated. No fracture or dislocation is seen involving the visualized skeleton. IMPRESSION: No acute abnormality seen in the right hip. Status post right total hip arthroplasty. Electronically Signed   By: Marijo Conception M.D.   On: 05/10/2019 15:26   DG FEMUR MIN 2 VIEWS LEFT  Result Date: 05/13/2019 CLINICAL DATA:  Intramedullary nail EXAM: LEFT FEMUR 2 VIEWS; DG C-ARM 1-60 MIN COMPARISON:  Radiography from 2 days ago FINDINGS: Multiple fluoroscopic images shows nail fixation of the left femur. By CT, a bone lesion is present proximally in the femur, not depicted on this study. IMPRESSION: Fluoroscopy for femoral fixation. Electronically Signed   By: Angelica Chessman  Watts M.D.   On: 05/13/2019 09:30   DG FEMUR, MIN 2 VIEWS RIGHT  Result Date: 05/11/2019 CLINICAL DATA:  ORIF of right femur. FLUOROSCOPY TIME:  1 minutes 58 seconds. Images: 10 EXAM: RIGHT FEMUR 2 VIEWS  COMPARISON:  None. FINDINGS: A displaced distal femoral diaphysis fracture seen at the beginning of the study. Throughout the study, the fracture was reduced and a sideplate was affixed to the distal femur, crossing the fracture. The patient is status post right hip replacement as well. Only the distal femoral stem is visualized of the right hip replacement. IMPRESSION: Right distal femoral fracture repair as above. Electronically Signed   By: Dorise Bullion III M.D   On: 05/11/2019 15:59   DG FEMUR, MIN 2 VIEWS RIGHT  Result Date: 05/11/2019 CLINICAL DATA:  Right femur fracture, postop. EXAM: RIGHT FEMUR 2 VIEWS COMPARISON:  Prior operative radiograph yesterday. FINDINGS: Lateral plate and multi screw fixation of distal femoral shaft fracture. Fracture is in improved alignment compared to preoperative imaging. Right hip arthroplasty appears intact. Recent postsurgical change includes air and edema in the soft tissues. IMPRESSION: Post ORIF distal right femur fracture in improved alignment compared to preoperative imaging. No immediate postoperative complications. Electronically Signed   By: Keith Rake M.D.   On: 05/11/2019 15:54   DG FEMUR, MIN 2 VIEWS RIGHT  Result Date: 05/10/2019 CLINICAL DATA:  Right leg injury. EXAM: RIGHT FEMUR 2 VIEWS COMPARISON:  None. FINDINGS: Severely displaced and angulated fracture is seen involving the distal right femoral shaft. Status post right total hip arthroplasty. No soft tissue abnormality is noted. IMPRESSION: Severely displaced and angulated distal right femoral shaft fracture. Electronically Signed   By: Marijo Conception M.D.   On: 05/10/2019 15:24   DG FEMUR PORT MIN 2 VIEWS LEFT  Result Date: 05/13/2019 CLINICAL DATA:  Femoral nail fixation EXAM: LEFT FEMUR PORTABLE 2 VIEWS COMPARISON:  Fluoroscopy earlier today FINDINGS: Intramedullary femoral nail in expected location, as are interlocking screws proximally and distally. No complicating fracture.  IMPRESSION: No unexpected finding after femoral fixation. Electronically Signed   By: Monte Fantasia M.D.   On: 05/13/2019 10:41   DG FEMUR PORT MIN 2 VIEWS LEFT  Result Date: 05/11/2019 CLINICAL DATA:  Fracture. Recent right femur fracture post surgical fixation. EXAM: LEFT FEMUR PORTABLE 2 VIEWS COMPARISON:  None. FINDINGS: No evidence of left femur fracture. There is questionable cortical thinning involving the proximal femur in the subtrochanteric region laterally with decreased bone density. Hip and knee alignment are grossly maintained. No soft tissue abnormality. IMPRESSION: 1. No evidence of left femur fracture. 2. Questionable cortical thinning involving the proximal femur in the subtrochanteric region laterally. Findings are suspicious for underlying bone lesion, recommend further evaluation with MRI when patient is able. Electronically Signed   By: Keith Rake M.D.   On: 05/11/2019 16:31       Subjective: Patient seen and examined at the bedside this morning.  Hemodynamically stable for discharge today.  Discharge Exam: Vitals:   05/18/19 0355 05/18/19 0801  BP: 119/65 128/79  Pulse: 61 75  Resp: 18 17  Temp: 97.6 F (36.4 C) 98.5 F (36.9 C)  SpO2: 93% 97%   Vitals:   05/17/19 1321 05/17/19 1930 05/18/19 0355 05/18/19 0801  BP: 138/88 (!) 141/80 119/65 128/79  Pulse: 75 79 61 75  Resp: 17 17 18 17   Temp: 97.8 F (36.6 C) 98.9 F (37.2 C) 97.6 F (36.4 C) 98.5 F (36.9 C)  TempSrc: Oral Oral Oral  Oral  SpO2: 95% 95% 93% 97%  Weight:      Height:        General: Pt is alert, awake, not in acute distress Cardiovascular: RRR, S1/S2 +, no rubs, no gallops Respiratory: CTA bilaterally, no wheezing, no rhonchi Abdominal: Soft, NT, ND, bowel sounds + Extremities: no edema, no cyanosis    The results of significant diagnostics from this hospitalization (including imaging, microbiology, ancillary and laboratory) are listed below for reference.      Microbiology: Recent Results (from the past 240 hour(s))  Respiratory Panel by RT PCR (Flu A&B, Covid) - Nasopharyngeal Swab     Status: None   Collection Time: 05/10/19  4:46 PM   Specimen: Nasopharyngeal Swab  Result Value Ref Range Status   SARS Coronavirus 2 by RT PCR NEGATIVE NEGATIVE Final    Comment: (NOTE) SARS-CoV-2 target nucleic acids are NOT DETECTED. The SARS-CoV-2 RNA is generally detectable in upper respiratoy specimens during the acute phase of infection. The lowest concentration of SARS-CoV-2 viral copies this assay can detect is 131 copies/mL. A negative result does not preclude SARS-Cov-2 infection and should not be used as the sole basis for treatment or other patient management decisions. A negative result may occur with  improper specimen collection/handling, submission of specimen other than nasopharyngeal swab, presence of viral mutation(s) within the areas targeted by this assay, and inadequate number of viral copies (<131 copies/mL). A negative result must be combined with clinical observations, patient history, and epidemiological information. The expected result is Negative. Fact Sheet for Patients:  PinkCheek.be Fact Sheet for Healthcare Providers:  GravelBags.it This test is not yet ap proved or cleared by the [place] FDA and  has been authorized for detection and/or diagnosis of SARS-CoV-2 by FDA under an Emergency Use Authorization (EUA). This EUA will remain  in effect (meaning this test can be used) for the duration of the COVID-19 declaration under Section 564(b)(1) of the Act, 21 U.S.C. section 360bbb-3(b)(1), unless the authorization is terminated or revoked sooner.    Influenza A by PCR NEGATIVE NEGATIVE Final   Influenza B by PCR NEGATIVE NEGATIVE Final    Comment: (NOTE) The Xpert Xpress SARS-CoV-2/FLU/RSV assay is intended as an aid in  the diagnosis of influenza from  Nasopharyngeal swab specimens and  should not be used as a sole basis for treatment. Nasal washings and  aspirates are unacceptable for Xpert Xpress SARS-CoV-2/FLU/RSV  testing. Fact Sheet for Patients: PinkCheek.be Fact Sheet for Healthcare Providers: GravelBags.it This test is not yet approved or cleared by the [place] FDA and  has been authorized for detection and/or diagnosis of SARS-CoV-2 by  FDA under an Emergency Use Authorization (EUA). This EUA will remain  in effect (meaning this test can be used) for the duration of the  Covid-19 declaration under Section 564(b)(1) of the Act, 21  U.S.C. section 360bbb-3(b)(1), unless the authorization is  terminated or revoked. Performed at [facility], [place] [address] [place], [place] [address]   Surgical pcr screen     Status: None   Collection Time: 05/13/19  4:18 AM   Specimen: Nasal Mucosa; Nasal Swab  Result Value Ref Range Status   MRSA, PCR NEGATIVE NEGATIVE Final   Staphylococcus aureus NEGATIVE NEGATIVE Final    Comment: (NOTE) The Xpert SA Assay (FDA approved for NASAL specimens in patients 64 years of age and older), is one component of a comprehensive surveillance program. It is not intended to diagnose infection nor to guide or  monitor treatment. Performed at [facility], [place] [address] [place], [place] [address]      Labs: BNP (last 3 results) No results for input(s): BNP in the last 8760 hours. Basic Metabolic Panel: Recent Labs  Lab 05/12/19 0433 05/13/19 0406  NA 138 139  K 5.0 4.1  CL 101 101  CO2 27 29  GLUCOSE 108* 91  BUN 20 20  CREATININE 0.90 1.06*  CALCIUM 8.6* 8.9   Liver Function Tests: Recent Labs  Lab 05/12/19 0939  AST 93*  ALT 68*  ALKPHOS 161*  BILITOT 0.5  PROT 6.3*  ALBUMIN 3.3*   No results for input(s): LIPASE, AMYLASE in the last 168 hours. No results for input(s): AMMONIA in the  last 168 hours. CBC: Recent Labs  Lab 05/12/19 0433 05/13/19 0406 05/14/19 0418 05/15/19 0626 05/16/19 0502  WBC 8.7 7.9 6.6 7.3 7.7  HGB 10.6* 9.8* 9.7* 9.6* 9.3*  HCT 32.1* 30.8* 30.3* 30.0* 27.7*  MCV 91.7 93.1 93.2 91.5 92.0  PLT 150 139* 121* 132* 146*   Cardiac Enzymes: No results for input(s): CKTOTAL, CKMB, CKMBINDEX, TROPONINI in the last 168 hours. BNP: Invalid input(s): POCBNP CBG: No results for input(s): GLUCAP in the last 168 hours. D-Dimer No results for input(s): DDIMER in the last 72 hours. Hgb A1c No results for input(s): HGBA1C in the last 72 hours. Lipid Profile No results for input(s): CHOL, HDL, LDLCALC, TRIG, CHOLHDL, LDLDIRECT in the last 72 hours. Thyroid function studies No results for input(s): TSH, T4TOTAL, T3FREE, THYROIDAB in the last 72 hours.  Invalid input(s): FREET3 Anemia work up No results for input(s): VITAMINB12, FOLATE, FERRITIN, TIBC, IRON, RETICCTPCT in the last 72 hours. Urinalysis No results found for: COLORURINE, APPEARANCEUR, [place], [place], [place], [place], [place], [place], PROTEINUR, UROBILINOGEN, NITRITE, LEUKOCYTESUR Sepsis Labs Invalid input(s): PROCALCITONIN,  WBC,  LACTICIDVEN Microbiology Recent Results (from the past 240 hour(s))  Respiratory Panel by RT PCR (Flu A&B, Covid) - Nasopharyngeal Swab     Status: None   Collection Time: 05/10/19  4:46 PM   Specimen: Nasopharyngeal Swab  Result Value Ref Range Status   SARS Coronavirus 2 by RT PCR NEGATIVE NEGATIVE Final    Comment: (NOTE) SARS-CoV-2 target nucleic acids are NOT DETECTED. The SARS-CoV-2 RNA is generally detectable in upper respiratoy specimens during the acute phase of infection. The lowest concentration of SARS-CoV-2 viral copies this assay can detect is 131 copies/mL. A negative result does not preclude SARS-Cov-2 infection and should not be used as the sole basis for treatment or other patient management decisions. A negative result may  occur with  improper specimen collection/handling, submission of specimen other than nasopharyngeal swab, presence of viral mutation(s) within the areas targeted by this assay, and inadequate number of viral copies (<131 copies/mL). A negative result must be combined with clinical observations, patient history, and epidemiological information. The expected result is Negative. Fact Sheet for Patients:  PinkCheek.be Fact Sheet for Healthcare Providers:  GravelBags.it This test is not yet ap proved or cleared by the [place] FDA and  has been authorized for detection and/or diagnosis of SARS-CoV-2 by FDA under an Emergency Use Authorization (EUA). This EUA will remain  in effect (meaning this test can be used) for the duration of the COVID-19 declaration under Section 564(b)(1) of the Act, 21 U.S.C. section 360bbb-3(b)(1), unless the authorization is terminated or revoked sooner.    Influenza A by PCR NEGATIVE NEGATIVE Final   Influenza B by PCR NEGATIVE NEGATIVE Final  Comment: (NOTE) The Xpert Xpress SARS-CoV-2/FLU/RSV assay is intended as an aid in  the diagnosis of influenza from Nasopharyngeal swab specimens and  should not be used as a sole basis for treatment. Nasal washings and  aspirates are unacceptable for Xpert Xpress SARS-CoV-2/FLU/RSV  testing. Fact Sheet for Patients: PinkCheek.be Fact Sheet for Healthcare Providers: GravelBags.it This test is not yet approved or cleared by the [place] FDA and  has been authorized for detection and/or diagnosis of SARS-CoV-2 by  FDA under an Emergency Use Authorization (EUA). This EUA will remain  in effect (meaning this test can be used) for the duration of the  Covid-19 declaration under Section 564(b)(1) of the Act, 21  U.S.C. section 360bbb-3(b)(1), unless the authorization is  terminated or  revoked. Performed at [facility], [place] [address], [place], [place] [address]   Surgical pcr screen     Status: None   Collection Time: 05/13/19  4:18 AM   Specimen: Nasal Mucosa; Nasal Swab  Result Value Ref Range Status   MRSA, PCR NEGATIVE NEGATIVE Final   Staphylococcus aureus NEGATIVE NEGATIVE Final    Comment: (NOTE) The Xpert SA Assay (FDA approved for NASAL specimens in patients 43 years of age and older), is one component of a comprehensive surveillance program. It is not intended to diagnose infection nor to guide or monitor treatment. Performed at [facility], [place] [address], [place], [place] [address]     Please note: You were cared for by a hospitalist during your hospital stay. Once you are discharged, your primary care physician will handle any further medical issues. Please note that NO REFILLS for any discharge medications will be authorized once you are discharged, as it is imperative that you return to your primary care physician (or establish a relationship with a primary care physician if you do not have one) for your post hospital discharge needs so that they can reassess your need for medications and monitor your lab values.    Time coordinating discharge: 40 minutes  SIGNED:   Shelly Coss, MD  [facility] 05/18/2019, 10:55 AM Pager [phone]  If 7PM-7AM, please contact night-coverage [email or web address] Password [email or web address]

## 2019-05-18 NOTE — Progress Notes (Signed)
Daily Progress Note   Patient Name: Yesenia Sullivan       Date: 05/18/2019 DOB: 1945/09/10  Age: 74 y.o. MRN#: [id number] Attending Physician: Shelly Coss, MD Primary Care Physician: Patient, No Pcp Per Admit Date: 05/10/2019  Reason for Consultation/Follow-up: Establishing goals of care  Subjective: RN at bedside, no complaints from patient, d/c later today with hospice  Length of Stay: 8  Current Medications: Scheduled Meds:  . acetaminophen  1,000 mg Oral Q6H  . dexamethasone  2 mg Oral BID WC  . oxyCODONE  15 mg Oral Q12H  . polyethylene glycol  17 g Oral Once  . senna  1 tablet Oral BID  . sodium chloride flush  3 mL Intravenous Q12H    Continuous Infusions: . sodium chloride 10 mL/hr at 05/17/19 1646    PRN Meds: sodium chloride, hydrALAZINE, magnesium hydroxide, methocarbamol, ondansetron **OR** ondansetron (ZOFRAN) IV, oxyCODONE, polyethylene glycol, sodium phosphate, sorbitol  Physical Exam Constitutional:      General: She is not in acute distress. Pulmonary:     Effort: Pulmonary effort is normal. No respiratory distress.  Skin:    General: Skin is warm and dry.  Neurological:     Mental Status: She is alert and oriented to person, place, and time.  Psychiatric:        Mood and Affect: Mood normal.        Behavior: Behavior normal.             Vital Signs: BP 128/79 (BP Location: Right Arm)   Pulse 75   Temp 98.5 F (36.9 C) (Oral)   Resp 17   Ht 5' 2.75" (1.594 m)   Wt 73.9 kg   SpO2 97%   BMI 29.10 kg/m  SpO2: SpO2: 97 % O2 Device: O2 Device: Room Air O2 Flow Rate: O2 Flow Rate (L/min): 2 L/min  Intake/output summary:   Intake/Output Summary (Last 24 hours) at 05/18/2019 0940 Last data filed at 05/18/2019 3254 Gross per 24 hour  Intake 624.99 ml    Output 1250 ml  Net -625.01 ml   LBM: Last BM Date: (03/29) Baseline Weight: Weight: 74.4 kg Most recent weight: Weight: 73.9 kg       Palliative Assessment/Data: PPS 30%      Patient Active Problem List   Diagnosis Date Noted  . Liver lesion   . Malignant neoplasm of overlapping sites of right breast in female, estrogen receptor positive ([place])   . Palliative care by specialist   . Bone pain   . Visceral pain   . Advanced care planning/counseling discussion   . Closed fracture of shaft of right femur ([place])   . Goals of care, counseling/discussion   . Metastatic cancer ([place]) 05/15/2019  . Lytic bone lesion of left femur 05/15/2019  . Pathological fracture of shaft of right femur ([place]) 05/10/2019    Palliative Care Assessment & Plan   HPI: 74 y.o. female with no past medical history   admitted on 05/10/2019 with femur fracture. Underwent surgery for fixation. Further workup has revealed diagnosis of breast cancer with metastasis to her liver, skull, ribs, scapula and possibly lungs. During admission she fractured her acromion. She has met with Oncology and  does not want to pursue further workup or treatment. Palliative medicine consulted for continued goals of care.   Assessment: Follow up with patient today - see initial consult 3/30 for full GOC discussion.  Patient feels that symptom management regimen is effective, no changes needed. Plans to d/c home later today with hospice. All questions answered and provided with my contact information.  Recommendations/Plan:  Continue oxycontin 15 mg BID with oxycodone 5-19m q4hr PRN breakthrough pain  Continue dexamethasone 238mdaily with breakfast and lunch  Continute 2 tabs senna QHS with miralax PRN   Please write above prescriptions for discharge  Hospice care at home  MOST and DNR complete  Goals of Care and Additional Recommendations:  Limitations on Scope of Treatment: Full Comfort Care  Code  Status:  DNR  Prognosis:   < 6 months  Discharge Planning:  Home with Hospice  Care plan was discussed with patient and RN  Thank you for allowing the Palliative Medicine Team to assist in the care of this patient.   Total Time 15 minutes Prolonged Time Billed  no       Greater than 50%  of this time was spent counseling and coordinating care related to the above assessment and plan.  ShJuel BurrowDNP, AGCopper Ridge Surgery Centeralliative Medicine Team Team Phone # [phone] [phone]

## 2019-05-18 NOTE — Care Management Important Message (Signed)
Important Message  Patient Details  Name: Yesenia Sullivan MRN: [id number] Date of Birth: 1946-01-20   Medicare Important Message Given:  Yes     Memory Argue 05/18/2019, 2:12 PM

## 2019-05-18 NOTE — Progress Notes (Signed)
PT Cancellation Note  Patient Details Name: Yesenia Sullivan MRN: [id number] DOB: Sep 29, 1945   Cancelled Treatment:    Reason Eval/Treat Not Completed: Other (comment). Per RN, pt d/c'ing today with hospice care, not appropriate for skilled PT at this time.    [place] 05/18/2019, 11:44 AM

## 2019-07-12 ENCOUNTER — Other Ambulatory Visit: Payer: Self-pay | Admitting: Family Medicine

## 2019-07-12 DIAGNOSIS — E2839 Other primary ovarian failure: Secondary | ICD-10-CM

## 2020-03-20 DEATH — deceased

## 2020-10-29 IMAGING — RF DG C-ARM 1-60 MIN
1 series · 7 of 7 positions shown · non-contrast
Comparison: Radiography from 2 days ago

CLINICAL DATA: Intramedullary nail

EXAM:
LEFT FEMUR 2 VIEWS; DG C-ARM 1-60 MIN

[Series 1: run · 7 of 7 slices shown]
[im 1/7]
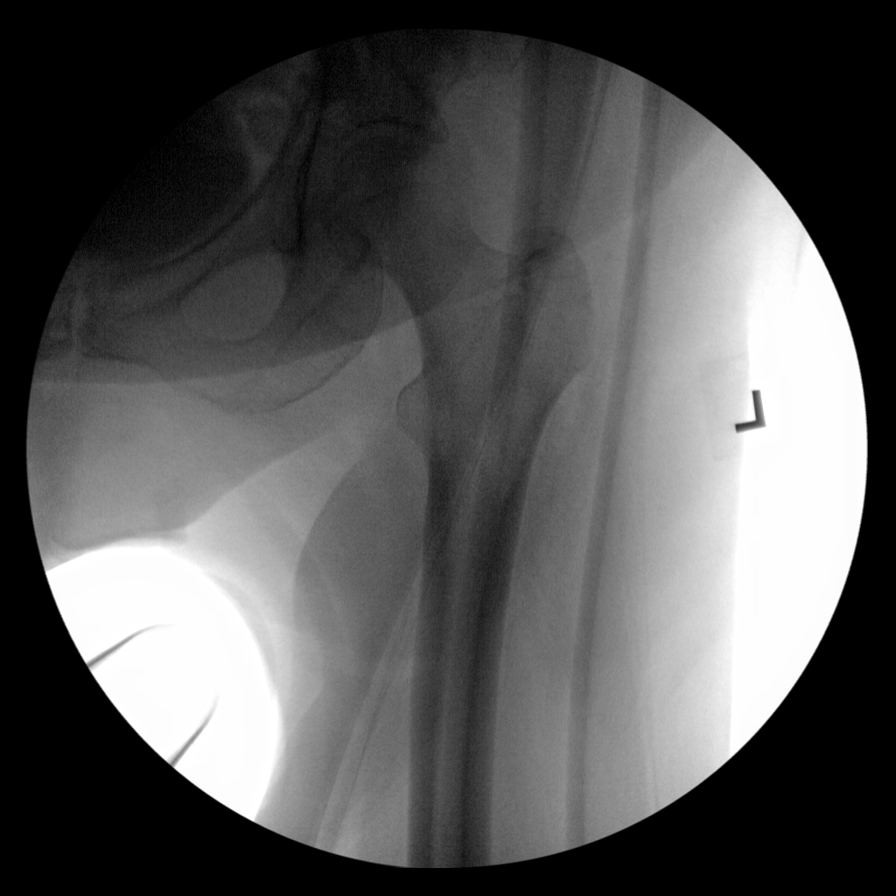
[im 2/7]
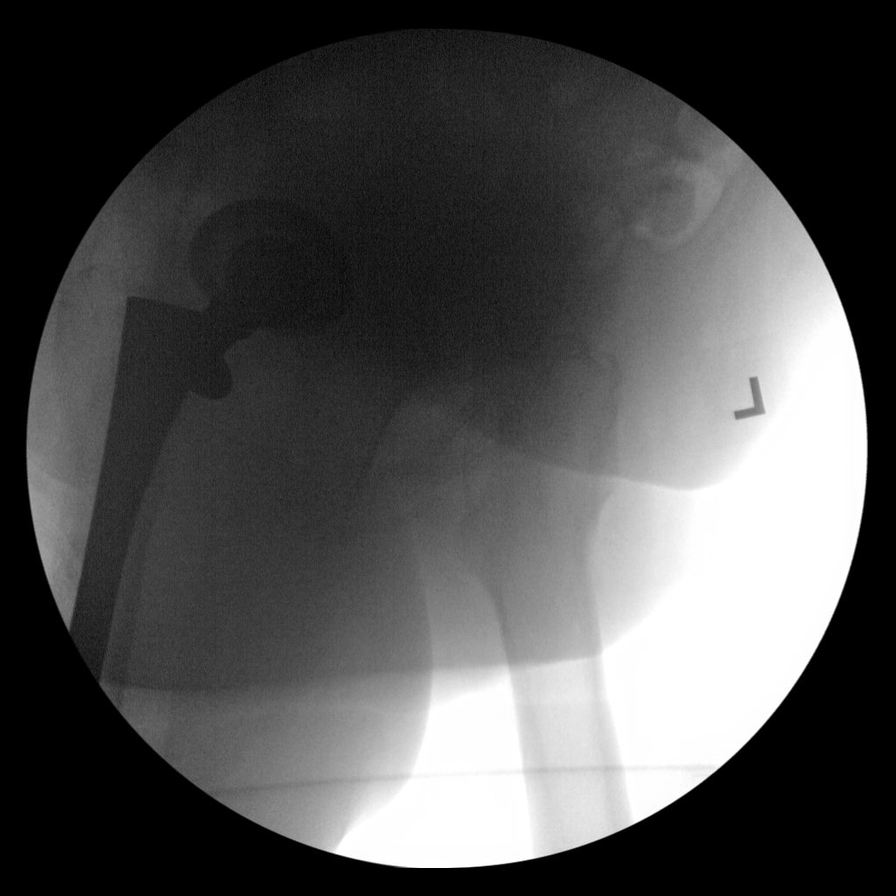
[im 3/7]
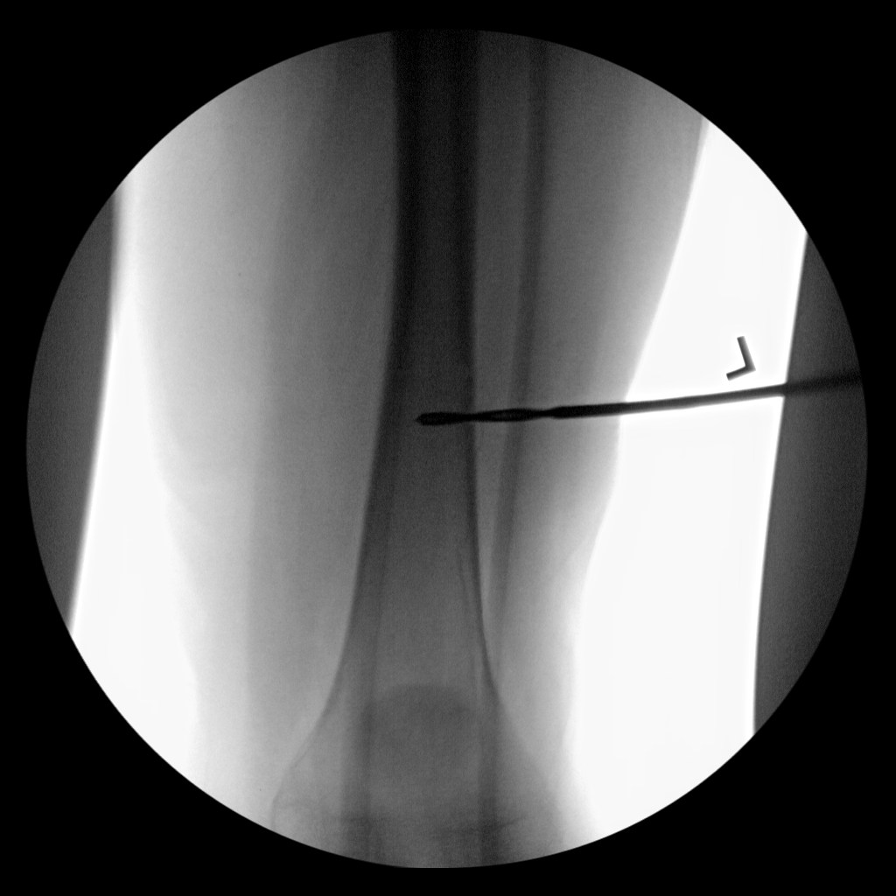
[im 4/7]
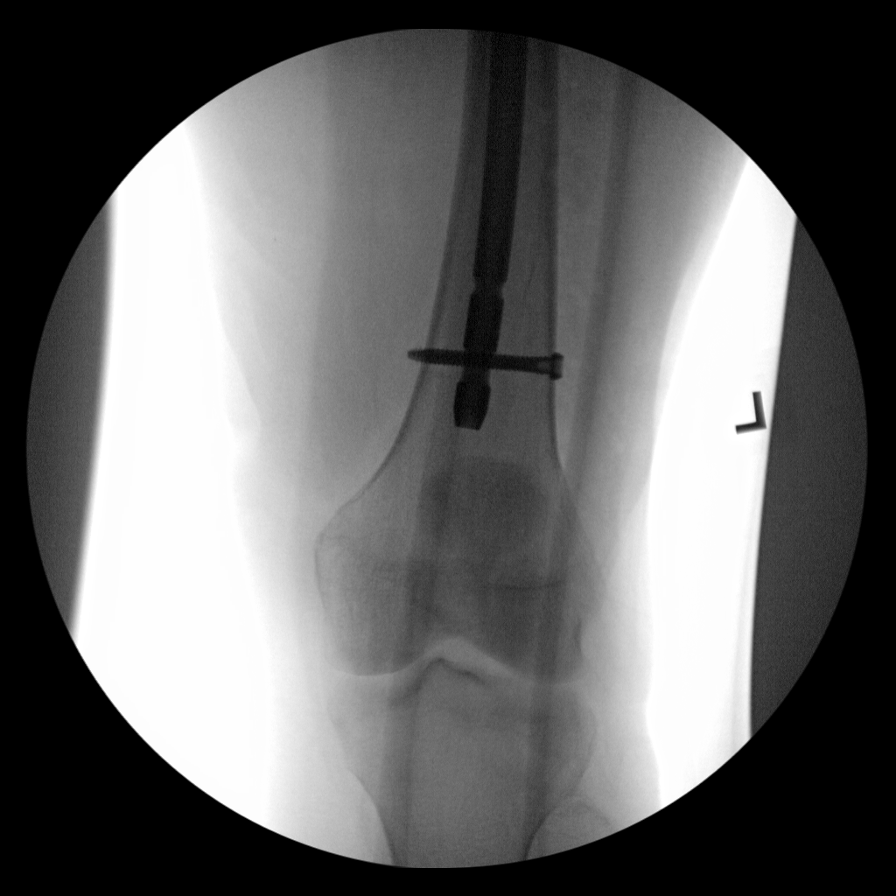
[im 5/7]
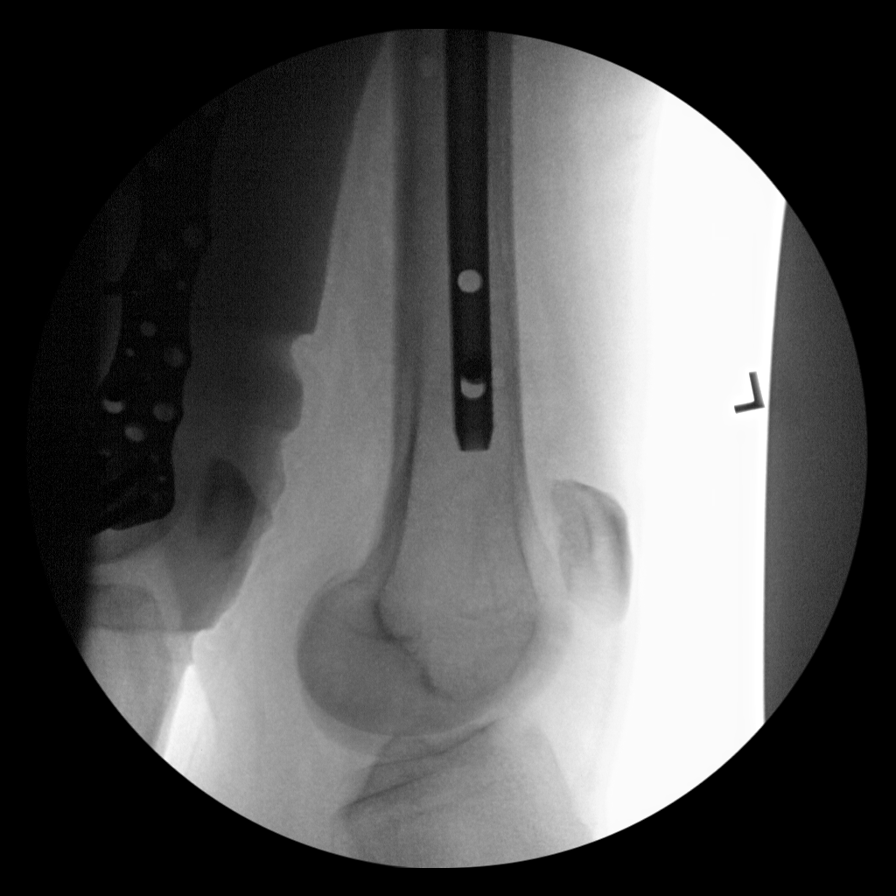
[im 6/7]
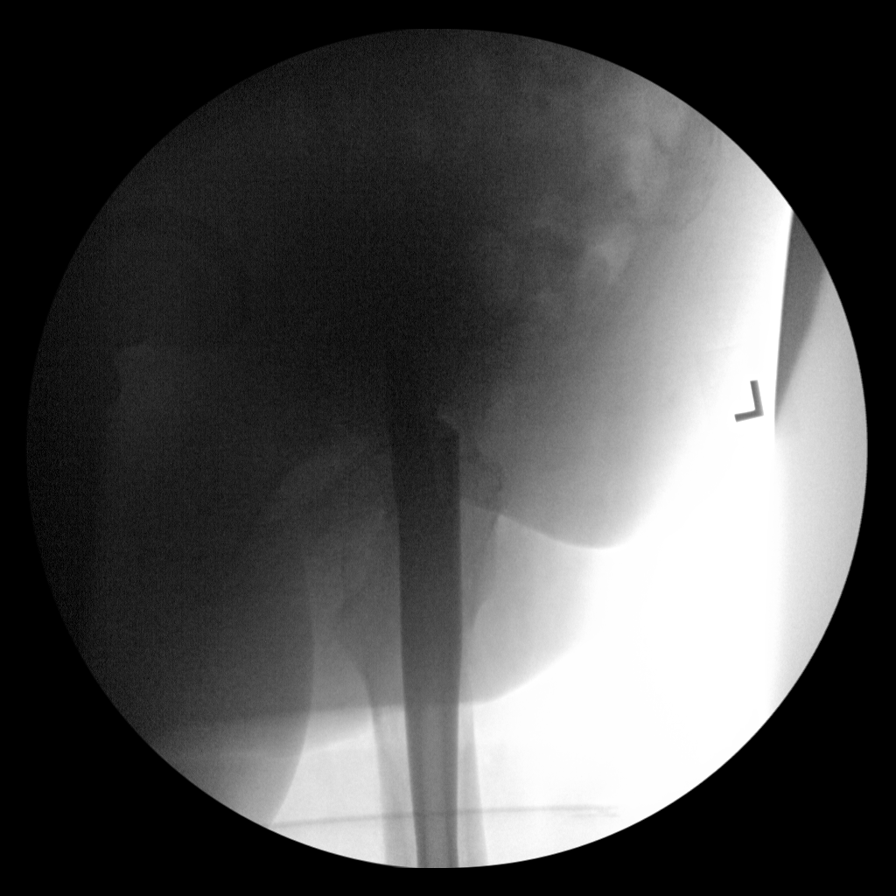
[im 7/7]
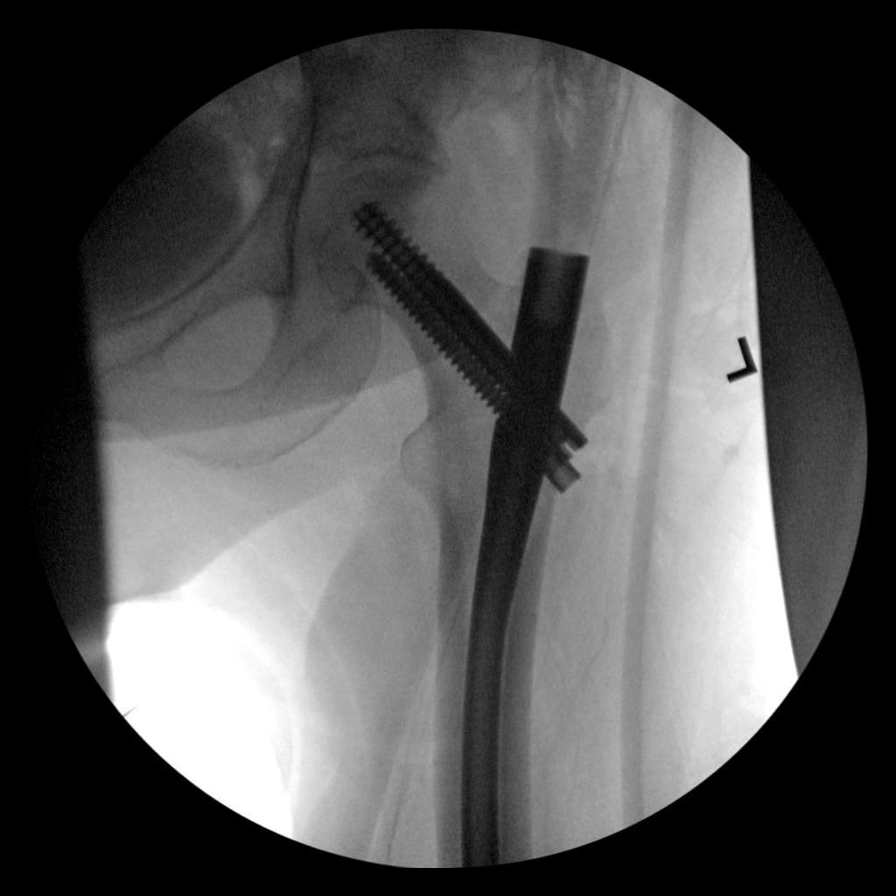

[7 of 7 positions shown; findings below may reference images not displayed]

FINDINGS: Multiple fluoroscopic images shows nail fixation of the left femur.
By CT, a bone lesion is present proximally in the femur, not
depicted on this study.
IMPRESSION: Fluoroscopy for femoral fixation.

## 2020-11-01 IMAGING — DX DG SHOULDER 2+V*R*
2 series · 2 of 2 positions shown · non-contrast
Comparison: 05/11/2019 humerus films.

CLINICAL DATA: Pain and bruising and immobility of the right
shoulder for approximately 6 weeks. No known injury.

EXAM:
RIGHT SHOULDER - 2+ VIEW

[x shoulder ap right]
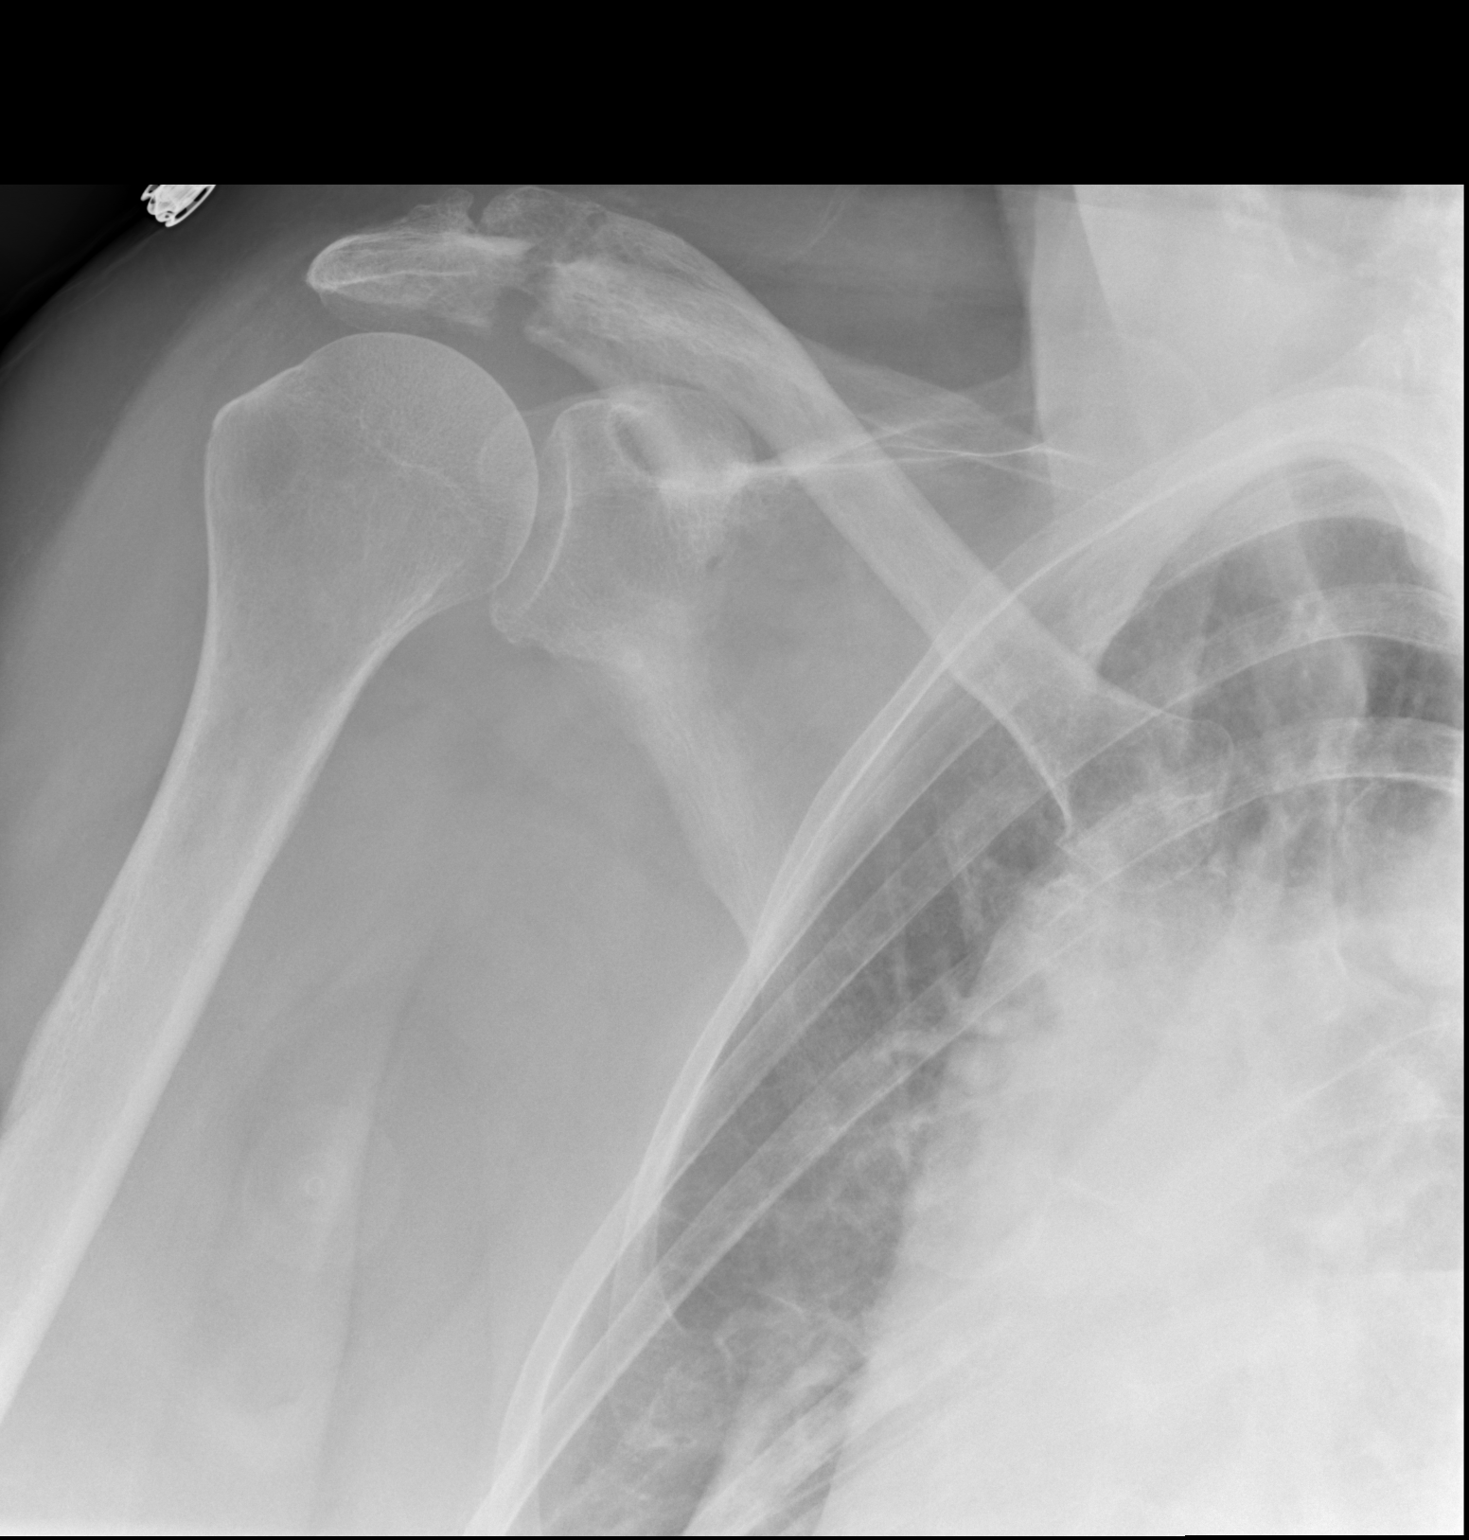

[x scapula y-view right]
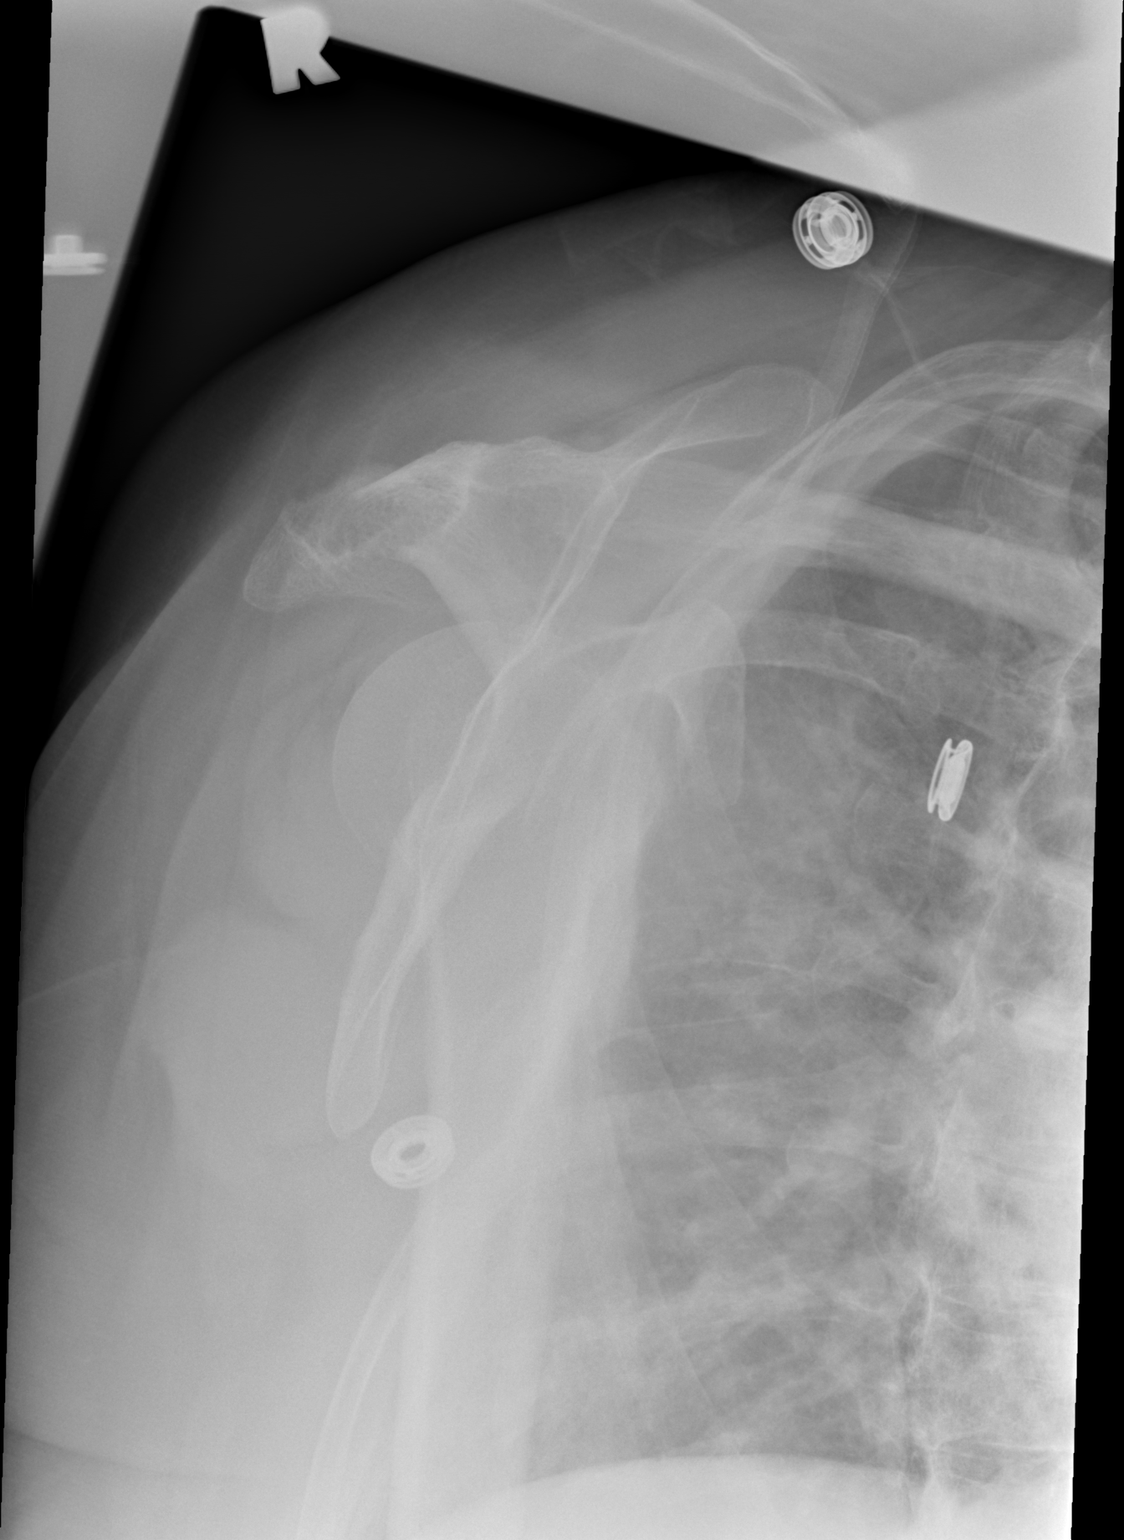

[2 of 2 positions shown; findings below may reference images not displayed]

FINDINGS: The glenohumeral joint is maintained. No fracture of the humeral
head or neck. There is a mildly displaced fracture through the base
of the acromion. The AC joint appears to be intact. No definite
clavicle fracture.
IMPRESSION: Mildly displaced fracture through the base of the acromion.
